# Patient Record
Sex: Female | Born: 1965
Health system: Southern US, Community
[De-identification: ages and names within clinical notes are randomized; demographics above are authoritative.]

## PROBLEM LIST (undated history)

## (undated) DIAGNOSIS — E119 Type 2 diabetes mellitus without complications: Secondary | ICD-10-CM

## (undated) DIAGNOSIS — F17200 Nicotine dependence, unspecified, uncomplicated: Secondary | ICD-10-CM

## (undated) HISTORY — PX: TUBAL LIGATION: SHX77

## (undated) HISTORY — DX: Nicotine dependence, unspecified, uncomplicated: F17.200

---

## 2003-12-12 ENCOUNTER — Emergency Department (HOSPITAL_COMMUNITY): Admission: EM | Admit: 2003-12-12 | Discharge: 2003-12-12 | Payer: Self-pay | Admitting: Emergency Medicine

## 2010-04-27 ENCOUNTER — Emergency Department (HOSPITAL_COMMUNITY): Admission: EM | Admit: 2010-04-27 | Discharge: 2010-04-27 | Payer: Self-pay | Admitting: Emergency Medicine

## 2012-07-13 ENCOUNTER — Emergency Department (HOSPITAL_COMMUNITY)
Admission: EM | Admit: 2012-07-13 | Discharge: 2012-07-13 | Disposition: A | Discharge status: Home / Self Care | Payer: Self-pay | Attending: Emergency Medicine | Admitting: Emergency Medicine

## 2012-07-13 ENCOUNTER — Encounter (HOSPITAL_COMMUNITY): Payer: Self-pay

## 2012-07-13 ENCOUNTER — Emergency Department (HOSPITAL_COMMUNITY): Payer: Self-pay

## 2012-07-13 DIAGNOSIS — S5290XA Unspecified fracture of unspecified forearm, initial encounter for closed fracture: Secondary | ICD-10-CM | POA: Insufficient documentation

## 2012-07-13 DIAGNOSIS — F17200 Nicotine dependence, unspecified, uncomplicated: Secondary | ICD-10-CM | POA: Insufficient documentation

## 2012-07-13 DIAGNOSIS — Y9389 Activity, other specified: Secondary | ICD-10-CM | POA: Insufficient documentation

## 2012-07-13 DIAGNOSIS — W010XXA Fall on same level from slipping, tripping and stumbling without subsequent striking against object, initial encounter: Secondary | ICD-10-CM | POA: Insufficient documentation

## 2012-07-13 DIAGNOSIS — Y9289 Other specified places as the place of occurrence of the external cause: Secondary | ICD-10-CM | POA: Insufficient documentation

## 2012-07-13 MED ORDER — TRAMADOL HCL 50 MG PO TABS
50.0000 mg | ORAL_TABLET | Freq: Once | ORAL | Status: AC
  Administered 2012-07-13: 50 mg via ORAL
  Filled 2012-07-13: qty 1

## 2012-07-13 MED ORDER — TRAMADOL HCL 50 MG PO TABS: 50.0000 mg | ORAL_TABLET | Freq: Four times a day (QID) | ORAL | Status: DC | PRN

## 2012-07-13 NOTE — Progress Notes (Signed)
Orthopedic Tech Progress Note Patient Details:  Linda Poole 12-11-1965 [id number] Applied volar splint to rt. forearm. Ortho Devices Type of Ortho Device: Arm sling;Short arm splint Ortho Device/Splint Interventions: Application   Lesle Chris 07/13/2012, 3:34 PM

## 2012-07-13 NOTE — ED Provider Notes (Signed)
History     CSN: [id number]  Arrival date & time 07/13/12  1313   First MD Initiated Contact with Patient 07/13/12 1419      Chief Complaint  Patient presents with  . Wrist Pain  . Fall    (Consider location/radiation/quality/duration/timing/severity/associated sxs/prior treatment) HPI Comments: 47 year old female presents emergency department complaining of right wrist and forearm pain after sustaining a fall about 4 a half hours prior to arrival. Patient states she was walking outside to go to work when she slipped and fell the ice and landed on her right hand. She then went on to go to work at [employer] and was unable to use her hand so her boss told her to go get it checked out. She tried taking Motrin with no relief. Describes pain as sharp and throbbing, rated 9/10. Moving her wrist makes the pain worse. Denies numbness or tingling. Denies hitting her head or loss of consciousness. Patient drove herself here today.  Patient is a 47 y.o. female presenting with wrist pain and fall. The history is provided by the patient.  Wrist Pain Pertinent negatives include no joint swelling or numbness.  Fall Pertinent negatives include no numbness.    History reviewed. No pertinent past medical history.  History reviewed. No pertinent past surgical history.  History reviewed. No pertinent family history.  History  Substance Use Topics  . Smoking status: Current Every Day Smoker -- 1.00 packs/day  . Smokeless tobacco: Not on file  . Alcohol Use: No    OB History   Grav Para Term Preterm Abortions TAB SAB Ect Mult Living                  Review of Systems  Musculoskeletal: Negative for joint swelling.       Positive for right wrist and forearm pain.  Skin: Negative for color change and wound.  Neurological: Negative for numbness.  All other systems reviewed and are negative.    Allergies  Review of patient's allergies indicates no known allergies.  Home Medications    Current Outpatient Rx  Name  Route  Sig  Dispense  Refill  . methadone (DOLOPHINE) 10 MG tablet   Oral   Take 10 mg by mouth every 8 (eight) hours.           BP 164/113  Pulse 112  Temp(Src) 98.1 F (36.7 C)  Resp 18  SpO2 94%  Physical Exam  Nursing note and vitals reviewed. Constitutional: She is oriented to person, place, and time. She appears well-developed and well-nourished. No distress.  HENT:  Head: Normocephalic and atraumatic.  Eyes: Conjunctivae and EOM are normal.  Neck: Normal range of motion. Neck supple.  Cardiovascular: Regular rhythm and intact distal pulses.  Tachycardia present.   Pulses:      Radial pulses are 2+ on the right side.  Good capillary refill.  Pulmonary/Chest: Effort normal and breath sounds normal.  Musculoskeletal:  Tenderness to palpation of right distal forearm and carpal bones. Positive snuffbox tenderness. Decreased range of motion with wrist flexion due to pain. Normal hand and elbow range of motion. No deformity. No edema, bruising or ecchymosis.  Neurological: She is alert and oriented to person, place, and time. No sensory deficit.  Skin: Skin is warm, dry and intact. No bruising and no ecchymosis noted.  Psychiatric: She has a normal mood and affect. Her behavior is normal.    ED Course  Procedures (including critical care time)  Labs Reviewed - No  data to display Dg Forearm Right  07/13/2012  *RADIOLOGY REPORT*  Clinical Data: Larey Seat today with hand and wrist pain  RIGHT FOREARM - 2 VIEW  Comparison: None.  Findings: There is a vague linear lucency across the distal right radius consistent with an impacted nondisplaced fracture.  An old well-corticated fracture fragment of the radial head is noted, but no joint effusion is seen.  Alignment is normal.  IMPRESSION: Subtle impaction fracture of the distal right radius.   Original Report Authenticated By: Dwyane Dee, M.D.    Dg Wrist Complete Right  07/13/2012  *RADIOLOGY REPORT*   Clinical Data: Larey Seat today with wrist and hand pain  RIGHT WRIST - COMPLETE 3+ VIEW  Comparison: None.  Findings: There is a subtle linear lucency through the distal right radius consistent with a minimally impacted fracture.  No extension into the radiocarpal joint space is seen.  The carpal bones are in normal position with normal intercarpal joint spaces.  IMPRESSION: Suspect subtle nondisplaced fracture of the distal right radius.   Original Report Authenticated By: Dwyane Dee, M.D.    Dg Hand Complete Right  07/13/2012  *RADIOLOGY REPORT*  Clinical Data: Slipped and fell today with hand and wrist pain  RIGHT HAND - COMPLETE 3+ VIEW  Comparison: None.  Findings: The carpal bones appear to be in normal position.  MCP, PIP, and DIP joints are unremarkable.  No acute fracture is seen.  IMPRESSION: No fracture.   Original Report Authenticated By: Dwyane Dee, M.D.      1. Radius fracture       MDM  47 year old female with subtle impaction fracture of distal right radius nondisplaced. She's able to find a ride home. She cannot have narcotic medications due to the fact that she is on methadone. I will give her tramadol. She will followup with orthopedics on Monday. Splint applied. Return precautions discussed. Patient states understanding of plan and is agreeable        Trevor Mace, PA-C 07/13/12 1506

## 2012-07-13 NOTE — ED Notes (Signed)
Pt presents with R hand, wrist and forearm pain after slipping on ice this morning.

## 2012-07-16 NOTE — ED Provider Notes (Signed)
Medical screening examination/treatment/procedure(s) were performed by non-physician practitioner and as supervising physician I was immediately available for consultation/collaboration.  Raeford Razor, MD 07/16/12 [phone]

## 2012-10-23 ENCOUNTER — Emergency Department (HOSPITAL_COMMUNITY)
Admission: EM | Admit: 2012-10-23 | Discharge: 2012-10-23 | Disposition: A | Discharge status: Home / Self Care | Payer: Self-pay | Attending: Emergency Medicine | Admitting: Emergency Medicine

## 2012-10-23 ENCOUNTER — Encounter (HOSPITAL_COMMUNITY): Payer: Self-pay | Admitting: Emergency Medicine

## 2012-10-23 DIAGNOSIS — E119 Type 2 diabetes mellitus without complications: Secondary | ICD-10-CM | POA: Insufficient documentation

## 2012-10-23 DIAGNOSIS — Z79899 Other long term (current) drug therapy: Secondary | ICD-10-CM | POA: Insufficient documentation

## 2012-10-23 DIAGNOSIS — F17200 Nicotine dependence, unspecified, uncomplicated: Secondary | ICD-10-CM | POA: Insufficient documentation

## 2012-10-23 DIAGNOSIS — Y939 Activity, unspecified: Secondary | ICD-10-CM | POA: Insufficient documentation

## 2012-10-23 DIAGNOSIS — W19XXXA Unspecified fall, initial encounter: Secondary | ICD-10-CM | POA: Insufficient documentation

## 2012-10-23 DIAGNOSIS — Y929 Unspecified place or not applicable: Secondary | ICD-10-CM | POA: Insufficient documentation

## 2012-10-23 LAB — CBC WITH DIFFERENTIAL/PLATELET
MCHC: 31.4 g/dL (ref 30.0–36.0)
MCV: 87.8 fL (ref 78.0–100.0)
Monocytes Relative: 6 % (ref 3–12)
Platelets: 280 10*3/uL (ref 150–400)
RDW: 14.8 % (ref 11.5–15.5)
WBC: 14.3 10*3/uL — ABNORMAL HIGH (ref 4.0–10.5)

## 2012-10-23 LAB — URINALYSIS, ROUTINE W REFLEX MICROSCOPIC
Bilirubin Urine: NEGATIVE
Glucose, UA: >1000 mg/dL — AB
Hgb urine dipstick: NEGATIVE
Leukocytes, UA: NEGATIVE
Nitrite: NEGATIVE
Protein, ur: NEGATIVE mg/dL

## 2012-10-23 LAB — BASIC METABOLIC PANEL
BUN: 16 mg/dL (ref 6–23)
CO2: 27 mEq/L (ref 19–32)
Calcium: 9.2 mg/dL (ref 8.4–10.5)
Creatinine, Ser: 0.35 mg/dL — ABNORMAL LOW (ref 0.50–1.10)
GFR calc non Af Amer: >90 mL/min (ref >90)
Glucose, Bld: 248 mg/dL — ABNORMAL HIGH (ref 70–99)

## 2012-10-23 LAB — GLUCOSE, CAPILLARY: Glucose-Capillary: 255 mg/dL — ABNORMAL HIGH (ref 70–99)

## 2012-10-23 LAB — URINE MICROSCOPIC-ADD ON

## 2012-10-23 MED ORDER — METFORMIN HCL 500 MG PO TABS: 500.0000 mg | ORAL_TABLET | Freq: Two times a day (BID) | ORAL | Status: DC

## 2012-10-23 NOTE — ED Provider Notes (Signed)
History     CSN: [id number]  Arrival date & time 10/23/12  1725   First MD Initiated Contact with Patient 10/23/12 2028      Chief Complaint  Patient presents with  . Fall    (Consider location/radiation/quality/duration/timing/severity/associated sxs/prior treatment) Patient is a 47 y.o. female presenting with fall. The history is provided by the patient. No language interpreter was used.  Fall This is a recurrent problem. The current episode started more than 1 month ago. The problem occurs intermittently. The problem has been unchanged. Associated symptoms include fatigue and headaches. Pertinent negatives include no chest pain, nausea, numbness or vomiting. She has tried nothing for the symptoms.    History reviewed. No pertinent past medical history.  No past surgical history on file.  No family history on file.  History  Substance Use Topics  . Smoking status: Current Every Day Smoker -- 1.00 packs/day  . Smokeless tobacco: Not on file  . Alcohol Use: No    OB History   Grav Para Term Preterm Abortions TAB SAB Ect Mult Living                  Review of Systems  Constitutional: Positive for fatigue.  Cardiovascular: Negative for chest pain.  Gastrointestinal: Negative for nausea and vomiting.  Endocrine: Positive for polydipsia and polyuria.  Genitourinary: Positive for frequency.  Neurological: Positive for headaches. Negative for numbness.  All other systems reviewed and are negative.    Allergies  Review of patient's allergies indicates no known allergies.  Home Medications   Current Outpatient Rx  Name  Route  Sig  Dispense  Refill  . ibuprofen (ADVIL,MOTRIN) 200 MG tablet   Oral   Take 200 mg by mouth every 6 (six) hours as needed for pain. For pain         . methadone (DOLOPHINE) 10 MG tablet   Oral   Take 10 mg by mouth every 8 (eight) hours.           BP 164/92  Pulse 90  Temp(Src) 98.6 F (37 C) (Oral)  Resp 18  SpO2  100%  Physical Exam  Vitals reviewed. Constitutional: She is oriented to person, place, and time. She appears well-developed and well-nourished.  HENT:  Head: Normocephalic and atraumatic.  Eyes: Pupils are equal, round, and reactive to light.  Neck: Normal range of motion. Neck supple.  Cardiovascular: Normal rate and regular rhythm.   Pulmonary/Chest: Effort normal and breath sounds normal.  Abdominal: Soft. Bowel sounds are normal. She exhibits no distension. There is no tenderness.  Musculoskeletal: Normal range of motion. She exhibits tenderness.       Arms: Lymphadenopathy:    She has no cervical adenopathy.  Neurological: She is alert and oriented to person, place, and time.  Skin: Skin is warm and dry.  Psychiatric: She has a normal mood and affect. Her behavior is normal. Judgment and thought content normal.    ED Course  Procedures (including critical care time)  Labs Reviewed  GLUCOSE, CAPILLARY - Abnormal; Notable for the following:    Glucose-Capillary 255 (*)    All other components within normal limits  GLUCOSE, CAPILLARY - Abnormal; Notable for the following:    Glucose-Capillary 232 (*)    All other components within normal limits   No results found.   No diagnosis found.  Labs consistent with diabetes (type 2).  Will start on metformin.  States she has an appointment with a PCP June 11.  Referral  provided to [facility].    MDM          Jimmye Norman, NP 10/23/12 2350

## 2012-10-23 NOTE — ED Notes (Signed)
Pt states that she has fallen three times in the last month.  States that she does not know why she falls.  Unsure if she is losing consciousness or if she is just feeling clumsy.  States that she went to the clinic and tested positive for alcohol and they told her that it could be because she is diabetic.  Pt states that she wants to be checked for "the sugar diabetes".

## 2012-10-24 NOTE — ED Provider Notes (Signed)
Medical screening examination/treatment/procedure(s) were performed by non-physician practitioner and as supervising physician I was immediately available for consultation/collaboration.   Gwyneth Sprout, MD 10/24/12 [phone]

## 2013-01-10 ENCOUNTER — Encounter (HOSPITAL_COMMUNITY): Payer: Self-pay | Admitting: Emergency Medicine

## 2013-01-10 ENCOUNTER — Emergency Department (HOSPITAL_COMMUNITY)
Admission: EM | Admit: 2013-01-10 | Discharge: 2013-01-11 | Disposition: A | Discharge status: Home / Self Care | Payer: Self-pay | Attending: Emergency Medicine | Admitting: Emergency Medicine

## 2013-01-10 DIAGNOSIS — K029 Dental caries, unspecified: Secondary | ICD-10-CM | POA: Insufficient documentation

## 2013-01-10 DIAGNOSIS — F17200 Nicotine dependence, unspecified, uncomplicated: Secondary | ICD-10-CM | POA: Insufficient documentation

## 2013-01-10 DIAGNOSIS — K047 Periapical abscess without sinus: Secondary | ICD-10-CM | POA: Insufficient documentation

## 2013-01-10 DIAGNOSIS — E119 Type 2 diabetes mellitus without complications: Secondary | ICD-10-CM | POA: Insufficient documentation

## 2013-01-10 DIAGNOSIS — Z79899 Other long term (current) drug therapy: Secondary | ICD-10-CM | POA: Insufficient documentation

## 2013-01-10 DIAGNOSIS — R599 Enlarged lymph nodes, unspecified: Secondary | ICD-10-CM | POA: Insufficient documentation

## 2013-01-10 DIAGNOSIS — Z792 Long term (current) use of antibiotics: Secondary | ICD-10-CM | POA: Insufficient documentation

## 2013-01-10 HISTORY — DX: Type 2 diabetes mellitus without complications: E11.9
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
## 2013-01-10 NOTE — ED Notes (Signed)
Pt c/o r side dental abscess x2 days. Swelling noted on r side of face radiating down to pts neck.  Pt NAD at this time.

## 2013-01-11 ENCOUNTER — Emergency Department (HOSPITAL_COMMUNITY): Payer: Self-pay

## 2013-01-11 ENCOUNTER — Encounter (HOSPITAL_COMMUNITY): Payer: Self-pay

## 2013-01-11 MED ORDER — CLINDAMYCIN HCL 300 MG PO CAPS
450.0000 mg | ORAL_CAPSULE | Freq: Once | ORAL | Status: DC
  Filled 2013-01-11: qty 1

## 2013-01-11 MED ORDER — SODIUM CHLORIDE 0.9 % IV BOLUS (SEPSIS)
1000.0000 mL | Freq: Once | INTRAVENOUS | Status: AC
  Administered 2013-01-11: 1000 mL via INTRAVENOUS

## 2013-01-11 MED ORDER — FENTANYL CITRATE 0.05 MG/ML IJ SOLN
50.0000 ug | Freq: Once | INTRAMUSCULAR | Status: AC
  Administered 2013-01-11: 50 ug via INTRAVENOUS
  Filled 2013-01-11: qty 2

## 2013-01-11 MED ORDER — CLINDAMYCIN PHOSPHATE 900 MG/50ML IV SOLN
900.0000 mg | Freq: Once | INTRAVENOUS | Status: AC
  Administered 2013-01-11: 900 mg via INTRAVENOUS
  Filled 2013-01-11: qty 50

## 2013-01-11 MED ORDER — IOHEXOL 300 MG/ML  SOLN
100.0000 mL | Freq: Once | INTRAMUSCULAR | Status: AC | PRN
  Administered 2013-01-11: 100 mL via INTRAVENOUS

## 2013-01-11 MED ORDER — TRAMADOL HCL 50 MG PO TABS: 50.0000 mg | ORAL_TABLET | Freq: Four times a day (QID) | ORAL | Status: DC | PRN

## 2013-01-11 MED ORDER — PENICILLIN V POTASSIUM 500 MG PO TABS: 500.0000 mg | ORAL_TABLET | Freq: Four times a day (QID) | ORAL | Status: AC

## 2013-01-11 NOTE — ED Provider Notes (Signed)
Patient received 900 mg clindamycin, IV.  She's been able to drink, and tolerate fluids.  She will followup with the dentist in the morning.  She has been given a prescription and pain medication prescribed by her previous mid-level.  Patient agrees with discharge, and followup plan  Arman Filter, NP 01/11/13 1610

## 2013-01-11 NOTE — ED Notes (Signed)
Patient transported to CT 

## 2013-01-11 NOTE — ED Provider Notes (Signed)
Medical screening examination/treatment/procedure(s) were performed by non-physician practitioner and as supervising physician I was immediately available for consultation/collaboration.   Odin Mariani L Graden Hoshino, MD 01/11/13 0754 

## 2015-05-25 ENCOUNTER — Emergency Department (HOSPITAL_COMMUNITY)
Admission: EM | Admit: 2015-05-25 | Discharge: 2015-05-25 | Disposition: A | Discharge status: Home / Self Care | Payer: Self-pay | Attending: Emergency Medicine | Admitting: Emergency Medicine

## 2015-05-25 ENCOUNTER — Encounter (HOSPITAL_COMMUNITY): Payer: Self-pay | Admitting: Emergency Medicine

## 2015-05-25 DIAGNOSIS — F17200 Nicotine dependence, unspecified, uncomplicated: Secondary | ICD-10-CM | POA: Insufficient documentation

## 2015-05-25 DIAGNOSIS — L03012 Cellulitis of left finger: Secondary | ICD-10-CM | POA: Insufficient documentation

## 2015-05-25 LAB — I-STAT VENOUS BLOOD GAS, ED
Acid-base deficit: 2 mmol/L (ref 0.0–2.0)
BICARBONATE: 23.3 meq/L (ref 20.0–24.0)
O2 Saturation: 99 %
PH VEN: 7.357 — AB (ref 7.250–7.300)
PO2 VEN: 143 mmHg — AB (ref 30.0–45.0)
TCO2: 25 mmol/L (ref 0–100)
pCO2, Ven: 41.5 mmHg — ABNORMAL LOW (ref 45.0–50.0)

## 2015-05-25 LAB — BASIC METABOLIC PANEL
Anion gap: 13 (ref 5–15)
CHLORIDE: 97 mmol/L — AB (ref 101–111)
CO2: 19 mmol/L — AB (ref 22–32)
CREATININE: 0.61 mg/dL (ref 0.44–1.00)
Calcium: 9.1 mg/dL (ref 8.9–10.3)
GFR calc Af Amer: >60 mL/min (ref >60)
GFR calc non Af Amer: >60 mL/min (ref >60)
Glucose, Bld: 614 mg/dL (ref 65–99)
Potassium: 3.8 mmol/L (ref 3.5–5.1)
Sodium: 129 mmol/L — ABNORMAL LOW (ref 135–145)

## 2015-05-25 LAB — CBG MONITORING, ED
GLUCOSE-CAPILLARY: 286 mg/dL — AB (ref 65–99)
Glucose-Capillary: >600 mg/dL (ref 65–99)
Glucose-Capillary: 312 mg/dL — ABNORMAL HIGH (ref 65–99)
Glucose-Capillary: 330 mg/dL — ABNORMAL HIGH (ref 65–99)

## 2015-05-25 MED ORDER — SODIUM CHLORIDE 0.9 % IV SOLN
INTRAVENOUS | Status: DC
  Filled 2015-05-25: qty 2.5

## 2015-05-25 MED ORDER — CEPHALEXIN 250 MG PO CAPS
500.0000 mg | ORAL_CAPSULE | Freq: Once | ORAL | Status: AC
  Administered 2015-05-25: 500 mg via ORAL
  Filled 2015-05-25: qty 2

## 2015-05-25 MED ORDER — DEXTROSE-NACL 5-0.45 % IV SOLN: INTRAVENOUS | Status: DC

## 2015-05-25 MED ORDER — CEPHALEXIN 500 MG PO CAPS: 500.0000 mg | ORAL_CAPSULE | Freq: Three times a day (TID) | ORAL | Status: DC

## 2015-05-25 MED ORDER — METFORMIN HCL 500 MG PO TABS: 500.0000 mg | ORAL_TABLET | Freq: Two times a day (BID) | ORAL | Status: DC

## 2015-05-25 MED ORDER — SODIUM CHLORIDE 0.9 % IV BOLUS (SEPSIS)
2000.0000 mL | Freq: Once | INTRAVENOUS | Status: AC
  Administered 2015-05-25: 2000 mL via INTRAVENOUS

## 2015-05-25 MED ORDER — LIDOCAINE HCL 2 % IJ SOLN
5.0000 mL | Freq: Once | INTRAMUSCULAR | Status: AC
  Administered 2015-05-25: 100 mg
  Filled 2015-05-25: qty 20

## 2015-05-25 MED ORDER — INSULIN ASPART 100 UNIT/ML ~~LOC~~ SOLN
5.0000 [IU] | Freq: Once | SUBCUTANEOUS | Status: AC
  Administered 2015-05-25: 5 [IU] via SUBCUTANEOUS
  Filled 2015-05-25: qty 1

## 2015-05-25 NOTE — Progress Notes (Signed)
[facility] ED CM consulted by [facility], [facility] for consult [facility] for [facility] and pcp assistance CM transferred to pt room phone CM spoke with pt who [place].  CM discussed and provided written information for uninsured accepting pcps, discussed the importance of pcp vs EDP services for f/u care, www.needymeds.org, www.goodrx.com, discounted pharmacies and other [place] resources such as [facility] , [facility], affordable care act, financial assistance, uninsured dental services, [place] med assist, DSS and  health department  Reviewed resources for [place] uninsured accepting pcps like [facility], [facility] medicine [facility] [address], [facility], [facility], local urgent care centers, [facility], [facility] family practice, general medical clinics, family services of the [place], [facility] urgent care plus others, medication resources, [facility] out patient pharmacies and housing Pt voiced understanding and appreciation of resources provided   Provided [facility] contact information Pt agreed to a referral Cm completed referral Pt to be contact by [facility] clinical liason  CM reviewed goodrx r/t metformin, methadone and ultram. Provided copies of goodrx cost list for each and discount card for [facility]adone for [facility] Discussed metformin and ultram on [facility] $4 list  Cm received fax confirmation at 05/25/15 1126 for 13 pages of uninsured resources including goodrx sheets to [phone]   Cm informed tracy to have clark Rn given resources to pt prior to leaving w/c ED  Pt denied further questions from her nor her sister at bedside

## 2015-05-25 NOTE — ED Provider Notes (Signed)
CSN: [id number]     Arrival date &[id number] time 05/25/15  [id number] History   First MD Initiated Contact with Patient 05/25/15 [phone]     No chief complaint on file.    (Consider location/radiation/quality/duration/timing/severity/associated sxs/prior Treatment) HPI Complains of painful left index finger at distal phalanx for the past 3 days. Patient also reports that her blood sugar has been running high. She last checked her blood sugar 3 or 4 days ago. Other associated symptoms include generalized malaise. She denies fever denies cough no nausea or vomiting no treatment prior to coming here. She's been somewhat noncompliant with her medications,, she's had trouble affording them Past Medical History  Diagnosis Date  . Diabetes mellitus without complication    No past surgical history on file. No family history on file. Social History  Substance Use Topics  . Smoking status: Current Every Day Smoker -- 1.00 packs/day  . Smokeless tobacco: Not on file  . Alcohol Use: No   no illicit drug use OB History    No data available     Review of Systems  Constitutional:       Generalized malaise  Musculoskeletal: Positive for arthralgias.       Left index finger pain  Allergic/Immunologic: Positive for immunocompromised state.      Allergies  Review of patient's allergies indicates no known allergies.  Home Medications   Prior to Admission medications   Medication Sig Start Date End Date Taking? Authorizing Provider  ibuprofen (ADVIL,MOTRIN) 200 MG tablet Take 800 mg by mouth every 6 (six) hours as needed for pain. For pain    Historical Provider, MD  metFORMIN (GLUCOPHAGE) 500 MG tablet Take 1 tablet (500 mg total) by mouth 2 (two) times daily with a meal. 10/23/12   Felicie Mornavid Smith, NP  methadone (DOLOPHINE) 10 MG tablet Take 10 mg by mouth every 8 (eight) hours.    Historical Provider, MD  traMADol (ULTRAM) 50 MG tablet Take 1 tablet (50 mg total) by mouth every 6 (six) hours as needed for  pain. 01/11/13   Hannah Muthersbaugh, PA-C   There were no vitals taken for this visit. Physical Exam  Constitutional: She appears well-developed and well-nourished.  HENT:  Head: Normocephalic and atraumatic.  U Chris membranes dry, poor dentition  Eyes: Conjunctivae are normal. Pupils are equal, round, and reactive to light.  Neck: Neck supple. No tracheal deviation present. No thyromegaly present.  Cardiovascular: Normal rate and regular rhythm.   No murmur heard. Pulmonary/Chest: Effort normal and breath sounds normal.  Abdominal: Soft. Bowel sounds are normal. She exhibits no distension. There is no tenderness.  Musculoskeletal: Normal range of motion. She exhibits no edema or tenderness.  Left upper extremity Index finger with paronychia. Otherwise atraumatic. No redness or swelling tenderness proximal to the cuticle. All other extremities without redness swelling or tenderness neurovascular intact  Neurological: She is alert. No cranial nerve deficit. Coordination normal.  Skin: Skin is warm and dry. No rash noted.  Psychiatric: She has a normal mood and affect.  Nursing note and vitals reviewed.   ED Course  Procedures (including critical care time) Labs Review Labs Reviewed - No data to display  Imaging Review No results found. I have personally reviewed and evaluated these images and lab results as part of my medical decision-making.   EKG Interpretation None     Paronychia at left index finger was incised and drained by me. Procedure Timeout performed. Digital block with 2% Xylocaine performed. A #11 blade was used  to make an incision at the cuticle with copious drainage of frank pus. Finger was then irrigated with tap water. Place a sterile bandage with anabolic ointment  1 PM patient feels much improved after treatment with intravenous fluids and subcutaneous insulin. Results for orders placed or performed during the hospital encounter of 05/25/15  Basic metabolic  panel  Result Value Ref Range   Sodium 129 (L) 135 - 145 mmol/L   Potassium 3.8 3.5 - 5.1 mmol/L   Chloride 97 (L) 101 - 111 mmol/L   CO2 19 (L) 22 - 32 mmol/L   Glucose, Bld 614 (HH) 65 - 99 mg/dL   BUN <5 (L) 6 - 20 mg/dL   Creatinine, Ser 4.09 0.44 - 1.00 mg/dL   Calcium 9.1 8.9 - 81.1 mg/dL   GFR calc non Af Amer >60 >60 mL/min   GFR calc Af Amer >60 >60 mL/min   Anion gap 13 5 - 15  CBG monitoring, ED  Result Value Ref Range   Glucose-Capillary >600 (HH) 65 - 99 mg/dL  I-Stat venous blood gas, ED  Result Value Ref Range   pH, Ven 7.357 (H) 7.250 - 7.300   pCO2, Ven 41.5 (L) 45.0 - 50.0 mmHg   pO2, Ven 143.0 (H) 30.0 - 45.0 mmHg   Bicarbonate 23.3 20.0 - 24.0 mEq/L   TCO2 25 0 - 100 mmol/L   O2 Saturation 99.0 %   Acid-base deficit 2.0 0.0 - 2.0 mmol/L   Patient temperature HIDE    Sample type VENOUS   CBG monitoring, ED  Result Value Ref Range   Glucose-Capillary 312 (H) 65 - 99 mg/dL  CBG monitoring, ED  Result Value Ref Range   Glucose-Capillary 330 (H) 65 - 99 mg/dL  CBG monitoring, ED  Result Value Ref Range   Glucose-Capillary 286 (H) 65 - 99 mg/dL   No results found.  MDM   is followed at methadone clinic. She needs primary care physician. Patient spoke with case manager who will arrange for orange card. She'll be referred to [facility] community wellness [facility]. Tylenol for pain. Prescription Keflex & . Metformin Wound recheck 2 or 3 days Final diagnoses:  None   diagnosis #1 paronychia left index finger #2 hyperglycemia #3 medication noncompliance      Doug Sou, MD 05/25/15 1308

## 2015-05-25 NOTE — ED Notes (Signed)
Patient complains of pain on the left index finger and states that her home blood glucose monitor has been reading "high" for the last two months.  CBG checked during triage, ED monitor also read "high".  Patient in no apparent distress at this time.

## 2015-05-25 NOTE — Discharge Instructions (Signed)
Paronychia  Take your metformin and the antibiotic as prescribed. Take Tylenol as directed for pain. Your finger should be rechecked in 3 days. Go to an urgent care center or the [facility]. [facility] today to get a primary care physician.. Return if concerned for any reason Paronychia is an infection of the skin. It happens near a fingernail or toenail. It may cause pain and swelling around the nail. Usually, it is not serious and it clears up with treatment. HOME CARE  Soak the fingers or toes in warm water as told by your doctor. You may be told to do this for 20 minutes, 2-3 times a day.  Keep the area dry when you are not soaking it.  Take medicines only as told by your doctor.  If you were given an antibiotic medicine, finish all of it even if you start to feel better.  Keep the affected area clean.  Do not try to drain a fluid-filled bump yourself.  Wear rubber gloves when putting your hands in water.  Wear gloves if your hands might touch cleaners or chemicals.  Follow your doctor's instructions about:  Wound care.  Bandage (dressing) changes and removal. GET HELP IF:  Your symptoms get worse or do not improve.  You have a fever or chills.  You have redness spreading from the affected area.  You have more fluid, blood, or pus coming from the affected area.  Your finger or knuckle is swollen or is hard to move.   This information is not intended to replace advice given to you by your health care provider. Make sure you discuss any questions you have with your health care provider.   Document Released: 05/04/2009 Document Revised: 09/30/2014 Document Reviewed: 04/23/2014 Elsevier Interactive Patient Education [employer]2016 Elsevier Inc.

## 2015-05-25 NOTE — Progress Notes (Signed)
Entered in d/c instructions goodrx.com Use to assist with discount costs at pharmacies Go to www.goodrx.com Please use the resources provided to you in emergency room by case manager to assist with doctor for follow up On 05/28/2015 A referral for you has been sent to [facility] if you have not received a call in 3 days you may contact them Call Scherry RanKaren Andrianos at [phone] Tuesday-Friday [email or web address] These [place] uninsured resources provide possible primary care providers, resources for discounted medications, housing, dental resources, affordable care act information, plus other resources for [facility] [place] [place]

## 2015-06-09 ENCOUNTER — Emergency Department (HOSPITAL_COMMUNITY): Payer: Self-pay

## 2015-06-09 ENCOUNTER — Inpatient Hospital Stay (HOSPITAL_COMMUNITY)
Admission: EM | Admit: 2015-06-09 | Discharge: 2015-06-15 | DRG: 854 | Disposition: A | Discharge status: Home / Self Care | Payer: Self-pay | Attending: Internal Medicine | Admitting: Internal Medicine

## 2015-06-09 ENCOUNTER — Encounter (HOSPITAL_COMMUNITY): Payer: Self-pay | Admitting: Emergency Medicine

## 2015-06-09 DIAGNOSIS — B951 Streptococcus, group B, as the cause of diseases classified elsewhere: Secondary | ICD-10-CM | POA: Diagnosis present

## 2015-06-09 DIAGNOSIS — R739 Hyperglycemia, unspecified: Secondary | ICD-10-CM

## 2015-06-09 DIAGNOSIS — E1100 Type 2 diabetes mellitus with hyperosmolarity without nonketotic hyperglycemic-hyperosmolar coma (NKHHC): Secondary | ICD-10-CM

## 2015-06-09 DIAGNOSIS — E870 Hyperosmolality and hypernatremia: Secondary | ICD-10-CM | POA: Diagnosis not present

## 2015-06-09 DIAGNOSIS — L03031 Cellulitis of right toe: Secondary | ICD-10-CM | POA: Diagnosis present

## 2015-06-09 DIAGNOSIS — R52 Pain, unspecified: Secondary | ICD-10-CM

## 2015-06-09 DIAGNOSIS — F17200 Nicotine dependence, unspecified, uncomplicated: Secondary | ICD-10-CM | POA: Diagnosis present

## 2015-06-09 DIAGNOSIS — E871 Hypo-osmolality and hyponatremia: Secondary | ICD-10-CM | POA: Diagnosis present

## 2015-06-09 DIAGNOSIS — E11621 Type 2 diabetes mellitus with foot ulcer: Secondary | ICD-10-CM | POA: Diagnosis present

## 2015-06-09 DIAGNOSIS — Z91128 Patient's intentional underdosing of medication regimen for other reason: Secondary | ICD-10-CM

## 2015-06-09 DIAGNOSIS — L97519 Non-pressure chronic ulcer of other part of right foot with unspecified severity: Secondary | ICD-10-CM | POA: Diagnosis present

## 2015-06-09 DIAGNOSIS — A419 Sepsis, unspecified organism: Principal | ICD-10-CM | POA: Diagnosis present

## 2015-06-09 DIAGNOSIS — E860 Dehydration: Secondary | ICD-10-CM | POA: Diagnosis present

## 2015-06-09 DIAGNOSIS — Z7984 Long term (current) use of oral hypoglycemic drugs: Secondary | ICD-10-CM

## 2015-06-09 DIAGNOSIS — D649 Anemia, unspecified: Secondary | ICD-10-CM | POA: Diagnosis present

## 2015-06-09 DIAGNOSIS — E11628 Type 2 diabetes mellitus with other skin complications: Secondary | ICD-10-CM | POA: Diagnosis present

## 2015-06-09 DIAGNOSIS — L02611 Cutaneous abscess of right foot: Secondary | ICD-10-CM | POA: Diagnosis present

## 2015-06-09 DIAGNOSIS — L089 Local infection of the skin and subcutaneous tissue, unspecified: Secondary | ICD-10-CM

## 2015-06-09 DIAGNOSIS — E876 Hypokalemia: Secondary | ICD-10-CM | POA: Diagnosis not present

## 2015-06-09 DIAGNOSIS — L03818 Cellulitis of other sites: Secondary | ICD-10-CM

## 2015-06-09 DIAGNOSIS — E1165 Type 2 diabetes mellitus with hyperglycemia: Secondary | ICD-10-CM | POA: Diagnosis present

## 2015-06-09 DIAGNOSIS — Z833 Family history of diabetes mellitus: Secondary | ICD-10-CM

## 2015-06-09 DIAGNOSIS — M869 Osteomyelitis, unspecified: Secondary | ICD-10-CM | POA: Diagnosis present

## 2015-06-09 DIAGNOSIS — E1169 Type 2 diabetes mellitus with other specified complication: Secondary | ICD-10-CM | POA: Diagnosis present

## 2015-06-09 DIAGNOSIS — T383X6A Underdosing of insulin and oral hypoglycemic [antidiabetic] drugs, initial encounter: Secondary | ICD-10-CM | POA: Diagnosis present

## 2015-06-09 DIAGNOSIS — L0390 Cellulitis, unspecified: Secondary | ICD-10-CM

## 2015-06-09 DIAGNOSIS — F1190 Opioid use, unspecified, uncomplicated: Secondary | ICD-10-CM | POA: Diagnosis present

## 2015-06-09 LAB — COMPREHENSIVE METABOLIC PANEL
ALT: 15 U/L (ref 14–54)
AST: 15 U/L (ref 15–41)
Albumin: 3.6 g/dL (ref 3.5–5.0)
Alkaline Phosphatase: 167 U/L — ABNORMAL HIGH (ref 38–126)
Anion gap: 16 — ABNORMAL HIGH (ref 5–15)
BILIRUBIN TOTAL: 0.5 mg/dL (ref 0.3–1.2)
CHLORIDE: 84 mmol/L — AB (ref 101–111)
CO2: 27 mmol/L (ref 22–32)
CREATININE: 0.72 mg/dL (ref 0.44–1.00)
Calcium: 10.1 mg/dL (ref 8.9–10.3)
GFR calc Af Amer: >60 mL/min (ref >60)
Glucose, Bld: 662 mg/dL (ref 65–99)
Potassium: 3.5 mmol/L (ref 3.5–5.1)
Sodium: 127 mmol/L — ABNORMAL LOW (ref 135–145)
TOTAL PROTEIN: 8.2 g/dL — AB (ref 6.5–8.1)

## 2015-06-09 LAB — CBC WITH DIFFERENTIAL/PLATELET
Basophils Absolute: 0 10*3/uL (ref 0.0–0.1)
Basophils Relative: 0 %
EOS PCT: 0 %
Eosinophils Absolute: 0 10*3/uL (ref 0.0–0.7)
HEMATOCRIT: 40.8 % (ref 36.0–46.0)
HEMOGLOBIN: 13.9 g/dL (ref 12.0–15.0)
Lymphocytes Relative: 10 %
Lymphs Abs: 2.8 10*3/uL (ref 0.7–4.0)
MCH: 30.5 pg (ref 26.0–34.0)
MCHC: 34.1 g/dL (ref 30.0–36.0)
MCV: 89.5 fL (ref 78.0–100.0)
MONOS PCT: 9 %
Monocytes Absolute: 2.5 10*3/uL — ABNORMAL HIGH (ref 0.1–1.0)
NEUTROS PCT: 81 %
Neutro Abs: 22.2 10*3/uL — ABNORMAL HIGH (ref 1.7–7.7)
Platelets: 339 10*3/uL (ref 150–400)
RBC: 4.56 MIL/uL (ref 3.87–5.11)
RDW: 13.4 % (ref 11.5–15.5)
WBC: 27.5 10*3/uL — AB (ref 4.0–10.5)

## 2015-06-09 LAB — URINALYSIS, ROUTINE W REFLEX MICROSCOPIC
Bilirubin Urine: NEGATIVE
Hgb urine dipstick: NEGATIVE
KETONES UR: NEGATIVE mg/dL
LEUKOCYTES UA: NEGATIVE
Nitrite: NEGATIVE
PH: 5.5 (ref 5.0–8.0)
Protein, ur: NEGATIVE mg/dL
Specific Gravity, Urine: 1.038 — ABNORMAL HIGH (ref 1.005–1.030)

## 2015-06-09 LAB — CBG MONITORING, ED
GLUCOSE-CAPILLARY: 200 mg/dL — AB (ref 65–99)
GLUCOSE-CAPILLARY: 302 mg/dL — AB (ref 65–99)
GLUCOSE-CAPILLARY: 572 mg/dL — AB (ref 65–99)
Glucose-Capillary: >600 mg/dL (ref 65–99)
Glucose-Capillary: 470 mg/dL — ABNORMAL HIGH (ref 65–99)

## 2015-06-09 LAB — URINE MICROSCOPIC-ADD ON

## 2015-06-09 LAB — PROCALCITONIN: PROCALCITONIN: 0.55 ng/mL

## 2015-06-09 LAB — URIC ACID: URIC ACID, SERUM: 2.7 mg/dL (ref 2.3–6.6)

## 2015-06-09 LAB — I-STAT CG4 LACTIC ACID, ED
LACTIC ACID, VENOUS: 3.83 mmol/L — AB (ref 0.5–2.0)
Lactic Acid, Venous: 3.36 mmol/L (ref 0.5–2.0)

## 2015-06-09 LAB — SEDIMENTATION RATE: SED RATE: 83 mm/h — AB (ref 0–22)

## 2015-06-09 MED ORDER — SODIUM CHLORIDE 0.9 % IV SOLN
INTRAVENOUS | Status: DC
  Administered 2015-06-09: 4.1 [IU]/h via INTRAVENOUS
  Filled 2015-06-09: qty 2.5

## 2015-06-09 MED ORDER — SODIUM CHLORIDE 0.9 % IV BOLUS (SEPSIS)
1000.0000 mL | Freq: Once | INTRAVENOUS | Status: AC
  Administered 2015-06-09: 1000 mL via INTRAVENOUS

## 2015-06-09 MED ORDER — VANCOMYCIN HCL IN DEXTROSE 750-5 MG/150ML-% IV SOLN
750.0000 mg | Freq: Two times a day (BID) | INTRAVENOUS | Status: DC
  Administered 2015-06-10 – 2015-06-15 (×11): 750 mg via INTRAVENOUS
  Filled 2015-06-09 (×13): qty 150

## 2015-06-09 MED ORDER — DEXTROSE 5 % IV SOLN
2.0000 g | INTRAVENOUS | Status: DC
  Administered 2015-06-09: 2 g via INTRAVENOUS
  Filled 2015-06-09: qty 2

## 2015-06-09 MED ORDER — VANCOMYCIN HCL 10 G IV SOLR
1250.0000 mg | Freq: Once | INTRAVENOUS | Status: AC
  Administered 2015-06-09: 1250 mg via INTRAVENOUS
  Filled 2015-06-09: qty 1250

## 2015-06-09 MED ORDER — METRONIDAZOLE IN NACL 5-0.79 MG/ML-% IV SOLN
500.0000 mg | Freq: Three times a day (TID) | INTRAVENOUS | Status: DC
  Administered 2015-06-09: 500 mg via INTRAVENOUS
  Filled 2015-06-09 (×3): qty 100

## 2015-06-09 MED ORDER — DEXTROSE-NACL 5-0.45 % IV SOLN: INTRAVENOUS | Status: DC

## 2015-06-09 MED ORDER — SODIUM CHLORIDE 0.9 % IV SOLN
1000.0000 mL | Freq: Once | INTRAVENOUS | Status: AC
  Administered 2015-06-09: 1000 mL via INTRAVENOUS

## 2015-06-09 MED ORDER — SODIUM CHLORIDE 0.9 % IV BOLUS (SEPSIS): 1000.0000 mL | Freq: Once | INTRAVENOUS | Status: DC

## 2015-06-09 MED ORDER — SODIUM CHLORIDE 0.9 % IV SOLN
1000.0000 mL | INTRAVENOUS | Status: DC
  Administered 2015-06-10: 1000 mL via INTRAVENOUS

## 2015-06-09 MED ORDER — SODIUM CHLORIDE 0.9 % IV BOLUS (SEPSIS)
2000.0000 mL | Freq: Once | INTRAVENOUS | Status: AC
  Administered 2015-06-09: 2000 mL via INTRAVENOUS

## 2015-06-09 MED ORDER — IBUPROFEN 800 MG PO TABS
800.0000 mg | ORAL_TABLET | Freq: Once | ORAL | Status: AC
  Administered 2015-06-09: 800 mg via ORAL
  Filled 2015-06-09: qty 1

## 2015-06-09 NOTE — H&P (Signed)
[facility] History and Physical  Rocio Roam ZOX:[id number] DOB: 14-Apr-1966 DOA: 06/09/2015  Referring physician: Ms.Westfall. PCP: No PCP Per Patient  Specialists: NONE.  Chief Complaint: Painful second toe of the right foot.  HPI: Linda Poole is a 50 y.o. female with history of diabetes mellitus type 2 as not been compliant with her medications presents to the ER because of increasing pain involving the right second toe. Patient's symptoms started 3 days ago. Denies any trauma or insect bites. Has progressively worsened with increasing swelling and pain and erythema moving towards to the proximal foot. In the ER patient was found to be tachycardic febrile and blood sugars were in the 600 with no anion gap. X-rays did not show any bony involvement. On exam patient has good distal foot pulses. Patient was started on empiric antibiotics and on IV insulin infusion and admitted for further management. Denies any chest pain or shortness of breath. Has had paronychia involving the left index finger over the last 3 weeks.   Review of Systems: As presented in the history of presenting illness, rest negative.  Past Medical History  Diagnosis Date  . Diabetes mellitus without complication [facility])    Past Surgical History  Procedure Laterality Date  . Tubal ligation     Social History:  reports that she has been smoking.  She does not have any smokeless tobacco history on file. She reports that she does not drink alcohol or use illicit drugs. Where does patient live home. Can patient participate in ADLs? Yes.  No Known Allergies  Family History:  Family History  Problem Relation Age of Onset  . Hypertension Mother   . Stroke Mother   . Diabetes Mellitus II Father       Prior to Admission medications   Medication Sig Start Date End Date Taking? Authorizing Provider  ibuprofen (ADVIL,MOTRIN) 200 MG tablet Take 1,200 mg by mouth every 4 (four) hours as needed (pain). For pain   Yes  Historical Provider, MD  metFORMIN (GLUCOPHAGE) 500 MG tablet Take 1 tablet (500 mg total) by mouth 2 (two) times daily with a meal. 05/25/15  Yes Doug Sou, MD  methadone (DOLOPHINE) 10 MG/5ML solution Take 120 mg by mouth every morning.   Yes Historical Provider, MD    Physical Exam: Filed Vitals:   06/09/15 1853  BP: 154/105  Pulse: 125  Temp: 98.8 F (37.1 C)  TempSrc: Oral  Resp: 18  SpO2: 97%     General:  Moderately built and nourished.  Eyes: Anicteric no pallor. ENT: No discharge from the ears eyes nose or mouth. Chest - no rhonchi or crepitations. Heart - S1 and S2 heard. Abdomen - soft nontender bowel sounds present. Skin - erythema over the right foot with right second toe inflamed and swollen. There is also swelling of the left index finger. Involving the distal phalanx. Musculoskeletal - as described in the skin section. Neurology - alert awake oriented to time place and person. Moves all extremities. Psychiatric - appears normal.     Recent Labs Lab 06/09/15 1915  NA 127*  K 3.5  CL 84*  CO2 27  GLUCOSE 662*  BUN <5*  CREATININE 0.72  CALCIUM 10.1   Liver Function Tests:  Recent Labs Lab 06/09/15 1915  AST 15  ALT 15  ALKPHOS 167*  BILITOT 0.5  PROT 8.2*  ALBUMIN 3.6   No results for input(s): LIPASE, AMYLASE in the last 168 hours. No results for input(s): AMMONIA in the last 168  hours. CBC:  Recent Labs Lab 06/09/15 1915  WBC 27.5*  NEUTROABS 22.2*  HGB 13.9  HCT 40.8  MCV 89.5  PLT 339   Cardiac Enzymes: No results for input(s): CKTOTAL, CKMB, CKMBINDEX, TROPONINI in the last 168 hours.  BNP (last 3 results) No results for input(s): BNP in the last 8760 hours.  ProBNP (last 3 results) No results for input(s): PROBNP in the last 8760 hours.  CBG:  Recent Labs Lab 06/09/15 1858 06/09/15 2034 06/09/15 2128  GLUCAP >600* 572* 470*    Radiological Exams on Admission: Dg Foot Complete Right  06/09/2015   CLINICAL DATA:  Injury to right foot 2 days ago with swelling, erythema and pain. Initial encounter. EXAM: RIGHT FOOT COMPLETE - 3+ VIEW COMPARISON:  None. FINDINGS: There is no evidence of fracture or dislocation. There is no evidence of arthropathy or other focal bone abnormality. Soft tissues are unremarkable. IMPRESSION: Negative. Electronically Signed   By: Irish LackGlenn  Yamagata M.D.   On: 06/09/2015 19:49     Assessment/Plan Principal Problem:   Sepsis (HCC) Active Problems:   Diabetic foot infection (HCC)   Hyperosmolar non-ketotic state in patient with type 2 diabetes mellitus (HCC)   1. Sepsis from cellulitis involving the right foot second toe - at this time I have ordered MRI of the right foot to rule out osteomyelitis. Check sedimentation rate and follow blood cultures and patient has been placed on vancomycin and Zosyn. Patient may need orthopedic consult in a.m. Patient also has left hand paranoid For which I have ordered left hand x-rays to rule out bony involvement. Follow lactic acid levels and procalcitonin levels and continue with aggressive hydration. Patient has received so far 4 L of normal saline bolus. 2. Hyperosmolar nonketotic diabetes mellitus type 2 - secondary to noncompliance with medications. Check hemoglobin A1c. If patient's blood sugar decreases less than 250 I'm going to place patient on Lantus 15 units subcutaneous with moderate dose sliding scale coverage. 3. Tobacco abuse - advised to quit smoking.   DVT Prophylaxis SCDs anticipation of possible procedure.  Code Status: Full code.   Family Communication: Discussed with patient.  Disposition Plan: Admit to inpatient.    Saquan Furtick N. [facility] Pager [phone].  If 7PM-7AM, please contact night-coverage [email or web address] Password [email or web address] 06/09/2015, 11:22 PM

## 2015-06-09 NOTE — ED Provider Notes (Signed)
  Face-to-face evaluation   History: She complains of gradually worsening swelling, and right foot associated with a toe infection. Similar recent infection in finger of left hand. She completed antibiotics 2 weeks ago.  Physical exam: Alert, tearful, anxious. Right foot, tender and swollen with reddening. Right second toe is very swollen, appears to be infected, with possible ischemia and subcutaneous pus.  Medical screening examination/treatment/procedure(s) were conducted as a shared visit with non-physician practitioner(s) and myself.  I personally evaluated the patient during the encounter  Mancel BaleElliott Lanise Mergen, MD 06/11/15 [phone]

## 2015-06-09 NOTE — Progress Notes (Signed)
ANTIBIOTIC CONSULT NOTE - INITIAL  Pharmacy Consult for vancomycin Indication: diabetic foot infection  No Known Allergies  Patient Measurements:   Adjusted Body Weight:   Vital Signs: Temp: 98.8 F (37.1 C) (01/10 1853) Temp Source: Oral (01/10 1853) BP: 154/105 mmHg (01/10 1853) Pulse Rate: 125 (01/10 1853) Intake/Output from previous day:   Intake/Output from this shift:    Labs:  Recent Labs  06/09/15 1915  WBC 27.5*  HGB 13.9  PLT 339  CREATININE 0.72   CrCl cannot be calculated (Unknown ideal weight.). No results for input(s): VANCOTROUGH, VANCOPEAK, VANCORANDOM, GENTTROUGH, GENTPEAK, GENTRANDOM, TOBRATROUGH, TOBRAPEAK, TOBRARND, AMIKACINPEAK, AMIKACINTROU, AMIKACIN in the last 72 hours.   Microbiology: No results found for this or any previous visit (from the past 720 hour(s)).  Medical History: Past Medical History  Diagnosis Date  . Diabetes mellitus without complication (HCC)     Assessment: 49 yof with wound to 2nd digit on R foot, swelling, redness. Pharmacy consulted to dose vancomycin for diabetic foot infection, high MRSA risk per MD. Afeb, wbc 27.5, SCr 0.72, CrCl~61 on admit. CTX/flagyl ordered per MD in ED. Recently treated with keflex for similar wound to R finger.  1/10 vanc>> 1/10 CTX>> 1/10 flagyl>>  Goal of Therapy:  Vancomycin trough level 15-20 mcg/ml  Plan:  Vanc 1250mg  IV x1; then Vanc 750mg  IV q12h CTX 2g IV q24h per MD Flagyl 500mg  IV q8h per MD Monitor clinical progress, c/s, renal function, abx plan/LOT VT@SS  as indicated  Babs BertinHaley Demone Lyles, PharmD, [facility] Clinical Pharmacist Pager [phone] 06/09/2015 8:40 PM

## 2015-06-09 NOTE — ED Provider Notes (Signed)
CSN: [id number]     Arrival date & time 06/09/15  1847 History   First MD Initiated Contact with Patient 06/09/15 2011     Chief Complaint  Patient presents with  . Wound Check  . Hyperglycemia    HPI   Linda Poole is a 50 y.o. female with a PMH of DM who presents to the ED with right 2nd toe pain and swelling, which she states started Sunday and has progressively worsened since that time. She reports her pain is constant and feels like someone is stabbing her. She states movement exacerbates her pain. She reports elevating her right lower extremity has provided minimal symptom relief. She denies fever, chills, abdominal pain, N/V, numbness, weakness, paresthesia. She states her blood sugar has been elevated recently, though she reports she has been taking her medications as directed. She was recently treated with keflex for paronychia to her left 2nd finger.    Past Medical History  Diagnosis Date  . Diabetes mellitus without complication (HCC)    History reviewed. No pertinent past surgical history. History reviewed. No pertinent family history. Social History  Substance Use Topics  . Smoking status: Current Every Day Smoker -- 1.00 packs/day  . Smokeless tobacco: None  . Alcohol Use: No   OB History    No data available      Review of Systems  Constitutional: Negative for fever and chills.  Gastrointestinal: Negative for nausea, vomiting and abdominal pain.  Musculoskeletal: Positive for arthralgias.  Skin: Positive for wound.  Neurological: Negative for weakness and numbness.  All other systems reviewed and are negative.     Allergies  Review of patient's allergies indicates no known allergies.  Home Medications   Prior to Admission medications   Medication Sig Start Date End Date Taking? Authorizing Provider  ibuprofen (ADVIL,MOTRIN) 200 MG tablet Take 1,200 mg by mouth every 4 (four) hours as needed (pain). For pain   Yes Historical Provider, MD  metFORMIN  (GLUCOPHAGE) 500 MG tablet Take 1 tablet (500 mg total) by mouth 2 (two) times daily with a meal. 05/25/15  Yes Doug Sou, MD  methadone (DOLOPHINE) 10 MG/5ML solution Take 120 mg by mouth every morning.   Yes Historical Provider, MD    BP 154/105 mmHg  Pulse 125  Temp(Src) 98.8 F (37.1 C) (Oral)  Resp 18  SpO2 97% Physical Exam  Constitutional: She is oriented to person, place, and time. She appears well-developed and well-nourished. No distress.  HENT:  Head: Normocephalic and atraumatic.  Right Ear: External ear normal.  Left Ear: External ear normal.  Nose: Nose normal.  Mouth/Throat: Uvula is midline, oropharynx is clear and moist and mucous membranes are normal.  Eyes: Conjunctivae, EOM and lids are normal. Pupils are equal, round, and reactive to light. Right eye exhibits no discharge. Left eye exhibits no discharge. No scleral icterus.  Neck: Normal range of motion. Neck supple.  Cardiovascular: Normal rate, regular rhythm, normal heart sounds, intact distal pulses and normal pulses.   Pulmonary/Chest: Effort normal and breath sounds normal. No respiratory distress. She has no wheezes. She has no rales.  Abdominal: Soft. Normal appearance and bowel sounds are normal. She exhibits no distension and no mass. There is no tenderness. There is no rigidity, no rebound and no guarding.  Musculoskeletal: She exhibits edema and tenderness.  Erythema and edema to right 2nd toe with decreased ROM due to pain. Erythema extends to dorsum of foot. Sensation to light touch intact. Distal pulses intact.  Neurological:  She is alert and oriented to person, place, and time. She has normal strength. No sensory deficit.  Skin: Skin is warm, dry and intact. No rash noted. She is not diaphoretic. No erythema. No pallor.  Psychiatric: She has a normal mood and affect. Her speech is normal and behavior is normal.  Nursing note and vitals reviewed.   ED Course  Procedures (including critical care  time)  Labs Review Labs Reviewed  CBC WITH DIFFERENTIAL/PLATELET - Abnormal; Notable for the following:    WBC 27.5 (*)    Neutro Abs 22.2 (*)    Monocytes Absolute 2.5 (*)    All other components within normal limits  COMPREHENSIVE METABOLIC PANEL - Abnormal; Notable for the following:    Sodium 127 (*)    Chloride 84 (*)    Glucose, Bld 662 (*)    BUN <5 (*)    Total Protein 8.2 (*)    Alkaline Phosphatase 167 (*)    Anion gap 16 (*)    All other components within normal limits  URINALYSIS, ROUTINE W REFLEX MICROSCOPIC (NOT AT [facility]) - Abnormal; Notable for the following:    Specific Gravity, Urine 1.038 (*)    Glucose, UA >1000 (*)    All other components within normal limits  URINE MICROSCOPIC-ADD ON - Abnormal; Notable for the following:    Squamous Epithelial / LPF 0-5 (*)    Bacteria, UA RARE (*)    All other components within normal limits  CBG MONITORING, ED - Abnormal; Notable for the following:    Glucose-Capillary >600 (*)    All other components within normal limits  I-STAT CG4 LACTIC ACID, ED - Abnormal; Notable for the following:    Lactic Acid, Venous 3.83 (*)    All other components within normal limits  CBG MONITORING, ED - Abnormal; Notable for the following:    Glucose-Capillary 572 (*)    All other components within normal limits  CBG MONITORING, ED - Abnormal; Notable for the following:    Glucose-Capillary 470 (*)    All other components within normal limits  CULTURE, BLOOD (ROUTINE X 2)  CULTURE, BLOOD (ROUTINE X 2)  PREGNANCY, URINE  SEDIMENTATION RATE  URIC ACID  HEMOGLOBIN A1C  PROCALCITONIN  I-STAT CG4 LACTIC ACID, ED    Imaging Review Dg Foot Complete Right  06/09/2015  CLINICAL DATA:  Injury to right foot 2 days ago with swelling, erythema and pain. Initial encounter. EXAM: RIGHT FOOT COMPLETE - 3+ VIEW COMPARISON:  None. FINDINGS: There is no evidence of fracture or dislocation. There is no evidence of arthropathy or other focal bone  abnormality. Soft tissues are unremarkable. IMPRESSION: Negative. Electronically Signed   By: Irish Lack M.D.   On: 06/09/2015 19:49   I have personally reviewed and evaluated these images and lab results as part of my medical decision-making.   EKG Interpretation None      MDM   Final diagnoses:  Diabetic foot infection [facility])  Hyperglycemia    50 year old female presents with right 2nd toe pain and swelling since Sunday. Reports she recently injured her toe, and that her injury precipitated her symptoms.   Patient is afebrile. Tachycardic. No hypotension. Erythema and edema to right 2nd toe with TTP and decreased ROM due to pain. Erythema extends to dorsum of foot. Sensation to light touch intact. Distal pulses intact.   CBC remarkable for leukocytosis of 27.5. CMP remarkable for sodium 127, glucose 662, alkaline phosphatase 167, anion gap 16. UA negative  for infection. Lactic acid elevated at 3.83. Imaging of right foot negative for fracture, dislocation, arthropathy, focal bone abnormality.  Started antibiotics per diabetic foot infection protocol (rocephin, flagyl, vancomycin). Started fluids and glucose stabilizer per DKA protocol.  Hospitalist consulted for admission. Spoke with Dr. Toniann FailKakrakandy, who will admit for further evaluation and management and advised additional fluid bolus administration.   Patient discussed with and seen by Dr. Effie ShyWentz.   BP 110/74 mmHg  Pulse 91  Temp(Src) 98.8 F (37.1 C) (Oral)  Resp 18  SpO2 98%     Mady Gemmalizabeth C Westfall, PA-C 06/10/15 0039  Mancel BaleElliott Wentz, MD 06/11/15 [phone]

## 2015-06-09 NOTE — ED Notes (Signed)
Pt here for wound to second digit on right foot with swelling and redness; pt with hx of DM

## 2015-06-10 ENCOUNTER — Inpatient Hospital Stay (HOSPITAL_COMMUNITY): Payer: Self-pay

## 2015-06-10 DIAGNOSIS — E1169 Type 2 diabetes mellitus with other specified complication: Secondary | ICD-10-CM

## 2015-06-10 DIAGNOSIS — A419 Sepsis, unspecified organism: Principal | ICD-10-CM

## 2015-06-10 DIAGNOSIS — L089 Local infection of the skin and subcutaneous tissue, unspecified: Secondary | ICD-10-CM

## 2015-06-10 DIAGNOSIS — E1101 Type 2 diabetes mellitus with hyperosmolarity with coma: Secondary | ICD-10-CM

## 2015-06-10 LAB — CBC WITH DIFFERENTIAL/PLATELET
Basophils Absolute: 0 10*3/uL (ref 0.0–0.1)
Basophils Relative: 0 %
Eosinophils Absolute: 0.1 10*3/uL (ref 0.0–0.7)
Eosinophils Relative: 1 %
HEMATOCRIT: 31.5 % — AB (ref 36.0–46.0)
HEMOGLOBIN: 10.4 g/dL — AB (ref 12.0–15.0)
LYMPHS ABS: 3.6 10*3/uL (ref 0.7–4.0)
LYMPHS PCT: 17 %
MCH: 29.4 pg (ref 26.0–34.0)
MCHC: 33 g/dL (ref 30.0–36.0)
MCV: 89 fL (ref 78.0–100.0)
Monocytes Absolute: 1.6 10*3/uL — ABNORMAL HIGH (ref 0.1–1.0)
Monocytes Relative: 8 %
NEUTROS ABS: 15.9 10*3/uL — AB (ref 1.7–7.7)
NEUTROS PCT: 75 %
Platelets: 298 10*3/uL (ref 150–400)
RBC: 3.54 MIL/uL — AB (ref 3.87–5.11)
RDW: 13.6 % (ref 11.5–15.5)
WBC: 21.2 10*3/uL — AB (ref 4.0–10.5)

## 2015-06-10 LAB — GLUCOSE, CAPILLARY
GLUCOSE-CAPILLARY: 341 mg/dL — AB (ref 65–99)
GLUCOSE-CAPILLARY: 347 mg/dL — AB (ref 65–99)
Glucose-Capillary: 244 mg/dL — ABNORMAL HIGH (ref 65–99)
Glucose-Capillary: 167 mg/dL — ABNORMAL HIGH (ref 65–99)

## 2015-06-10 LAB — BASIC METABOLIC PANEL
Anion gap: 16 — ABNORMAL HIGH (ref 5–15)
BUN: <5 mg/dL — ABNORMAL LOW (ref 6–20)
CALCIUM: 9.8 mg/dL (ref 8.9–10.3)
CO2: 27 mmol/L (ref 22–32)
CREATININE: 0.55 mg/dL (ref 0.44–1.00)
Chloride: 87 mmol/L — ABNORMAL LOW (ref 101–111)
GFR calc Af Amer: >60 mL/min (ref >60)
GLUCOSE: 527 mg/dL — AB (ref 65–99)
Potassium: 3.3 mmol/L — ABNORMAL LOW (ref 3.5–5.1)
Sodium: 130 mmol/L — ABNORMAL LOW (ref 135–145)

## 2015-06-10 LAB — COMPREHENSIVE METABOLIC PANEL
ALK PHOS: 117 U/L (ref 38–126)
ALT: 12 U/L — ABNORMAL LOW (ref 14–54)
ANION GAP: 7 (ref 5–15)
AST: 17 U/L (ref 15–41)
Albumin: 2.3 g/dL — ABNORMAL LOW (ref 3.5–5.0)
BUN: <5 mg/dL — ABNORMAL LOW (ref 6–20)
CALCIUM: 7.9 mg/dL — AB (ref 8.9–10.3)
CO2: 26 mmol/L (ref 22–32)
Chloride: 102 mmol/L (ref 101–111)
Creatinine, Ser: 0.36 mg/dL — ABNORMAL LOW (ref 0.44–1.00)
GFR calc non Af Amer: >60 mL/min (ref >60)
Glucose, Bld: 302 mg/dL — ABNORMAL HIGH (ref 65–99)
POTASSIUM: 3.1 mmol/L — AB (ref 3.5–5.1)
SODIUM: 135 mmol/L (ref 135–145)
Total Bilirubin: 0.4 mg/dL (ref 0.3–1.2)
Total Protein: 5.6 g/dL — ABNORMAL LOW (ref 6.5–8.1)

## 2015-06-10 LAB — LACTIC ACID, PLASMA
Lactic Acid, Venous: 2.3 mmol/L (ref 0.5–2.0)
Lactic Acid, Venous: 1.6 mmol/L (ref 0.5–2.0)

## 2015-06-10 LAB — PREGNANCY, URINE: PREG TEST UR: NEGATIVE

## 2015-06-10 MED ORDER — METHADONE HCL 40 MG PO TBSO: 120.0000 mg | ORAL_TABLET | Freq: Every day | ORAL | Status: DC

## 2015-06-10 MED ORDER — METHADONE HCL 10 MG/5ML PO SOLN: 120.0000 mg | Freq: Every morning | ORAL | Status: DC

## 2015-06-10 MED ORDER — INSULIN STARTER KIT- SYRINGES (ENGLISH)
1.0000 | Freq: Once | Status: AC
  Administered 2015-06-11: 1
  Filled 2015-06-10: qty 1

## 2015-06-10 MED ORDER — INSULIN ASPART 100 UNIT/ML ~~LOC~~ SOLN
0.0000 [IU] | Freq: Three times a day (TID) | SUBCUTANEOUS | Status: DC
Start: 2015-06-11 — End: 2015-06-15
  Administered 2015-06-11: 15 [IU] via SUBCUTANEOUS
  Administered 2015-06-11 – 2015-06-12 (×2): 3 [IU] via SUBCUTANEOUS
  Administered 2015-06-12 – 2015-06-13 (×2): 5 [IU] via SUBCUTANEOUS
  Administered 2015-06-13 – 2015-06-14 (×5): 3 [IU] via SUBCUTANEOUS
  Administered 2015-06-15: 8 [IU] via SUBCUTANEOUS
  Administered 2015-06-15: 2 [IU] via SUBCUTANEOUS

## 2015-06-10 MED ORDER — PIPERACILLIN-TAZOBACTAM 3.375 G IVPB
3.3750 g | Freq: Once | INTRAVENOUS | Status: AC
  Administered 2015-06-10: 3.375 g via INTRAVENOUS
  Filled 2015-06-10: qty 50

## 2015-06-10 MED ORDER — MORPHINE SULFATE (PF) 2 MG/ML IV SOLN
1.0000 mg | INTRAVENOUS | Status: DC | PRN
  Administered 2015-06-10 (×4): 1 mg via INTRAVENOUS
  Filled 2015-06-10 (×4): qty 1

## 2015-06-10 MED ORDER — INSULIN REGULAR BOLUS VIA INFUSION: 0.0000 [IU] | Freq: Three times a day (TID) | INTRAVENOUS | Status: DC

## 2015-06-10 MED ORDER — DEXTROSE 50 % IV SOLN: 25.0000 mL | INTRAVENOUS | Status: DC | PRN

## 2015-06-10 MED ORDER — INSULIN GLARGINE 100 UNIT/ML ~~LOC~~ SOLN
15.0000 [IU] | Freq: Every day | SUBCUTANEOUS | Status: DC
  Administered 2015-06-10 (×2): 15 [IU] via SUBCUTANEOUS
  Filled 2015-06-10 (×3): qty 0.15

## 2015-06-10 MED ORDER — SODIUM CHLORIDE 0.9 % IV SOLN
INTRAVENOUS | Status: DC
Start: 2015-06-10 — End: 2015-06-10
  Administered 2015-06-10: 03:00:00 via INTRAVENOUS

## 2015-06-10 MED ORDER — SODIUM CHLORIDE 0.9 % IV SOLN
INTRAVENOUS | Status: DC
  Administered 2015-06-10 (×2): via INTRAVENOUS

## 2015-06-10 MED ORDER — METHADONE HCL 10 MG PO TABS
120.0000 mg | ORAL_TABLET | Freq: Every day | ORAL | Status: DC
  Administered 2015-06-10 – 2015-06-11 (×2): 120 mg via ORAL
  Filled 2015-06-10: qty 12

## 2015-06-10 MED ORDER — ONDANSETRON HCL 4 MG/2ML IJ SOLN: 4.0000 mg | Freq: Four times a day (QID) | INTRAMUSCULAR | Status: DC | PRN

## 2015-06-10 MED ORDER — POTASSIUM CHLORIDE CRYS ER 20 MEQ PO TBCR
40.0000 meq | EXTENDED_RELEASE_TABLET | Freq: Once | ORAL | Status: AC
  Administered 2015-06-10: 40 meq via ORAL
  Filled 2015-06-10: qty 2

## 2015-06-10 MED ORDER — ACETAMINOPHEN 325 MG PO TABS
650.0000 mg | ORAL_TABLET | Freq: Four times a day (QID) | ORAL | Status: DC | PRN
  Administered 2015-06-10 – 2015-06-11 (×4): 650 mg via ORAL
  Filled 2015-06-10 (×4): qty 2

## 2015-06-10 MED ORDER — ONDANSETRON HCL 4 MG PO TABS: 4.0000 mg | ORAL_TABLET | Freq: Four times a day (QID) | ORAL | Status: DC | PRN

## 2015-06-10 MED ORDER — SODIUM CHLORIDE 0.9 % IV SOLN: INTRAVENOUS | Status: DC

## 2015-06-10 MED ORDER — PIPERACILLIN-TAZOBACTAM 3.375 G IVPB
3.3750 g | Freq: Three times a day (TID) | INTRAVENOUS | Status: DC
  Administered 2015-06-10 – 2015-06-15 (×15): 3.375 g via INTRAVENOUS
  Filled 2015-06-10 (×20): qty 50

## 2015-06-10 MED ORDER — NICOTINE 21 MG/24HR TD PT24
21.0000 mg | MEDICATED_PATCH | Freq: Every day | TRANSDERMAL | Status: DC
  Administered 2015-06-10 – 2015-06-14 (×5): 21 mg via TRANSDERMAL
  Filled 2015-06-10 (×6): qty 1

## 2015-06-10 MED ORDER — LIVING WELL WITH DIABETES BOOK
Freq: Once | Status: AC
  Administered 2015-06-10: 23:00:00
  Filled 2015-06-10: qty 1

## 2015-06-10 MED ORDER — INSULIN ASPART 100 UNIT/ML ~~LOC~~ SOLN
0.0000 [IU] | Freq: Three times a day (TID) | SUBCUTANEOUS | Status: DC
  Administered 2015-06-10: 11 [IU] via SUBCUTANEOUS
  Administered 2015-06-10: 5 [IU] via SUBCUTANEOUS
  Administered 2015-06-10: 3 [IU] via SUBCUTANEOUS

## 2015-06-10 MED ORDER — ACETAMINOPHEN 650 MG RE SUPP: 650.0000 mg | Freq: Four times a day (QID) | RECTAL | Status: DC | PRN

## 2015-06-10 MED ORDER — MORPHINE SULFATE (PF) 4 MG/ML IV SOLN
3.0000 mg | INTRAVENOUS | Status: DC | PRN
  Administered 2015-06-10 – 2015-06-13 (×16): 3 mg via INTRAVENOUS
  Filled 2015-06-10 (×16): qty 1

## 2015-06-10 MED ORDER — INSULIN ASPART 100 UNIT/ML ~~LOC~~ SOLN
0.0000 [IU] | Freq: Every day | SUBCUTANEOUS | Status: DC
  Administered 2015-06-10: 4 [IU] via SUBCUTANEOUS
  Administered 2015-06-11: 3 [IU] via SUBCUTANEOUS
  Administered 2015-06-12 – 2015-06-13 (×2): 2 [IU] via SUBCUTANEOUS

## 2015-06-10 NOTE — Progress Notes (Signed)
Pharmacy Antibiotic Follow-up Note Linda RocheJulie Poole is a 50 y.o. year-old female admitted on 06/09/2015. The patient is currently on day 1 of Zosyn and vancomycin for osteomyelitis of the distal phalanx of the second toe with adjacent cellulitis in setting of DM.  Assessment/Plan: 1. Continue EI Zosyn 3.375 gram every 8 hours as already ordered 2. Vancomycin 750 mg q 12 hours 3. [facility]; goal 15-20 4. Following daily; notes prn  Temp (24hrs), Avg:98.9 F (37.2 C), Min:98.7 F (37.1 C), Max:99.2 F (37.3 C)   Recent Labs Lab 06/09/15 1915 06/10/15 0515  WBC 27.5* 21.2*    Recent Labs Lab 06/09/15 1915 06/09/15 2115 06/10/15 0515  CREATININE 0.72 0.55 0.36*   CrCl cannot be calculated (Unknown ideal weight.).    No Known Allergies  Antimicrobials this admission: 1/10 Ceftriaxone x 1 dose 1/10 Metronidazole x 1 dose  1/10 vancomycin >>  1/11 Zosyn >>   Levels/dose changes this admission: None to date   Microbiology results: 1/10 BCx: px   Thank you for allowing pharmacy to be a part of this patient's care.  Pollyann SamplesAndy Jeyson Deshotel, PharmD, BCPS 06/10/2015, 10:54 AM Pager: [phone]

## 2015-06-10 NOTE — Progress Notes (Addendum)
PROGRESS NOTE    Linda Poole:[id number] DOB: 01/11/1966 DOA: 06/09/2015 PCP: No PCP Per Patient  HPI/Brief narrative 50 year old female patient with history of DM 2, noncompliant with medications, tobacco abuse, presented with painful right second toe. In the ED, tachycardic, febrile, blood glucose in the 600 range without increased anion gap. MRI foot confirmed right second toe osteomyelitis. Orthopedics consulted.   Assessment/Plan:   Sepsis, present on admission, secondary to right second toe osteomyelitis/cellulitis - Continue IV vancomycin and Zosyn - Orthopedics/Dr. Sharol Given consulted-input pending.  Left second finger paronychia - No acute findings on clinical exam or x-ray.  Uncontrolled type II DM with hyperosmolar state - Secondary to noncompliance with medications. - Briefly placed on insulin drip and then transitioned to Lantus and SSI. These will need titration as needed. Follow A1c.  Tobacco abuse - Cessation counseled.  Hypokalemia - Replace and follow.  Anemia - Follow CBCs  Hyponatremia - Secondary to dehydration and hyperglycemia. Improved. Follow BMP  Chronic Methadone Rx    DVT prophylaxis: SCDs Code Status: Full Family Communication: Discussed with sister at bedside Disposition Plan: DC home when medically stable.   Consultants:  Orthopedic/Dr. Sharol Given  Procedures:  None  Antimicrobials:  IV Zosyn 1/10 >  IV vancomycin 1/10 >   Subjective: Right second toe pain. Denies any other complaints.  Objective: Filed Vitals:   06/10/15 0000 06/10/15 0142 06/10/15 0238 06/10/15 1306  BP: 110/74 107/72 114/72 110/67  Pulse: 91 88 92 97  Temp:  99.2 F (37.3 C) 98.7 F (37.1 C)   TempSrc:  Rectal Oral   Resp:  18 18 18   SpO2: 98% 94% 97% 97%    Intake/Output Summary (Last 24 hours) at 06/10/15 1707 Last data filed at 06/10/15 1305  Gross per 24 hour  Intake 3768.75 ml  Output      0 ml  Net 3768.75 ml   There were no vitals filed  for this visit.  Exam:  General exam: Pleasant middle-aged female sitting up in bed, tearful but in no painful distress. Respiratory system: Clear. No increased work of breathing. Cardiovascular system: S1 & S2 heard, RRR. No JVD, murmurs, gallops, clicks or pedal edema. Gastrointestinal system: Abdomen is nondistended, soft and nontender. Normal bowel sounds heard. Central nervous system: Alert and oriented. No focal neurological deficits. Extremities: Symmetric 5 x 5 power. Right second toe swollen, black and blue discoloration, tender to touch and painful range of motion movements. Bounding dorsalis pedis. Please see pictures below.        Data Reviewed: Basic Metabolic Panel:  Recent Labs Lab 06/09/15 1915 06/09/15 2115 06/10/15 0515  NA 127* 130* 135  K 3.5 3.3* 3.1*  CL 84* 87* 102  CO2 27 27 26   GLUCOSE 662* 527* 302*  BUN <5* <5* <5*  CREATININE 0.72 0.55 0.36*  CALCIUM 10.1 9.8 7.9*   Liver Function Tests:  Recent Labs Lab 06/09/15 1915 06/10/15 0515  AST 15 17  ALT 15 12*  ALKPHOS 167* 117  BILITOT 0.5 0.4  PROT 8.2* 5.6*  ALBUMIN 3.6 2.3*   No results for input(s): LIPASE, AMYLASE in the last 168 hours. No results for input(s): AMMONIA in the last 168 hours. CBC:  Recent Labs Lab 06/09/15 1915 06/10/15 0515  WBC 27.5* 21.2*  NEUTROABS 22.2* 15.9*  HGB 13.9 10.4*  HCT 40.8 31.5*  MCV 89.5 89.0  PLT 339 298   Cardiac Enzymes: No results for input(s): CKTOTAL, CKMB, CKMBINDEX, TROPONINI in the last 168 hours. BNP (last  3 results) No results for input(s): PROBNP in the last 8760 hours. CBG:  Recent Labs Lab 06/09/15 2244 06/09/15 2359 06/10/15 0641 06/10/15 1144 06/10/15 1621  GLUCAP 302* 200* 244* 341* 167*    No results found for this or any previous visit (from the past 240 hour(s)).       Studies: Dg Hand 2 View Left  06/10/2015  CLINICAL DATA:  Follow-up left index finger cellulitis, history of diabetes EXAM: LEFT HAND  - 2 VIEW COMPARISON:  None. FINDINGS: Two views of the left hand submitted. No acute fracture or subluxation. No periosteal reaction or bony erosion. Mild soft tissue swelling noted distal aspect of the second finger. IMPRESSION: No acute fracture or subluxation. Mild soft tissue swelling second finger. No evidence of osteomyelitis. Electronically Signed   By: Lahoma Crocker M.D.   On: 06/10/2015 09:16   Mr Foot Right Wo Contrast  06/10/2015  CLINICAL DATA:  Pain and cellulitis of the second toe of the right foot. EXAM: MRI OF THE RIGHT FOREFOOT WITHOUT CONTRAST TECHNIQUE: Multiplanar, multisequence MR imaging was performed. No intravenous contrast was administered. COMPARISON:  Radiographs dated 06/09/2015 FINDINGS: There is destruction of the tuft of the distal phalanx of the second toe with abnormal signal throughout the distal phalangeal bone of the second toe with a soft tissue edema and swelling of the toe. Bandages in place. No joint effusions.  The other bones of the forefoot are normal. IMPRESSION: Osteomyelitis of the distal phalanx of the second toe with adjacent cellulitis. Electronically Signed   By: Lorriane Shire M.D.   On: 06/10/2015 09:32   Dg Foot Complete Right  06/09/2015  CLINICAL DATA:  Injury to right foot 2 days ago with swelling, erythema and pain. Initial encounter. EXAM: RIGHT FOOT COMPLETE - 3+ VIEW COMPARISON:  None. FINDINGS: There is no evidence of fracture or dislocation. There is no evidence of arthropathy or other focal bone abnormality. Soft tissues are unremarkable. IMPRESSION: Negative. Electronically Signed   By: Aletta Edouard M.D.   On: 06/09/2015 19:49        Scheduled Meds: . insulin aspart  0-15 Units Subcutaneous TID WC  . insulin glargine  15 Units Subcutaneous QHS  . insulin starter kit- syringes  1 kit Other Once  . living well with diabetes book   Does not apply Once  . methadone  120 mg Oral Daily  . nicotine  21 mg Transdermal Daily  .  piperacillin-tazobactam (ZOSYN)  IV  3.375 g Intravenous 3 times per day  . vancomycin  750 mg Intravenous Q12H   Continuous Infusions: . sodium chloride 125 mL/hr at 06/10/15 0232    Principal Problem:   Sepsis ([place]) Active Problems:   Diabetic foot infection ([place])   Hyperosmolar non-ketotic state in patient with type 2 diabetes mellitus ([place])    Time spent: 26 minutes    Livianna Petraglia, MD, FACP, FHM. [facility] Pager [phone]  If 7PM-7AM, please contact night-coverage [email or web address] Password [email or web address] 06/10/2015, 5:07 PM    LOS: 1 day

## 2015-06-10 NOTE — ED Notes (Signed)
Spoke to provider, d/c glucose stabilizer and give Lantis per orders.  Can go to tele bed

## 2015-06-10 NOTE — Progress Notes (Signed)
Utilization review completed.  

## 2015-06-10 NOTE — Progress Notes (Signed)
Inpatient Diabetes Program Recommendations  AACE/ADA: New Consensus Statement on Inpatient Glycemic Control (2015)  Target Ranges:  Prepandial:   less than 140 mg/dL      Peak postprandial:   less than 180 mg/dL (1-2 hours)      Critically ill patients:  140 - 180 mg/dL    Results for Jilda RocheRYCE, Laasya (MRN [id number]) as of 06/10/2015 09:51  Ref. Range 06/09/2015 18:58 06/09/2015 20:34 06/09/2015 21:28 06/09/2015 22:44 06/09/2015 23:59 06/10/2015 06:41  Glucose-Capillary Latest Ref Range: 65-99 mg/dL >865>600 (HH) 784572 (HH) 696470 (H) 302 (H) 200 (H) 244 (H)    Admit with: Sepsis/ Cellulitis from Toe Infection  History: DM  Home DM Meds: Metformin 500 mg bid  Current Insulin Orders: Lantus 15 units QHS      Novolog Moderate SSI (0-15 units) TID AC    -Current A1c level pending.  -Patient does not have PCP nor health insurance.  Will need to keep cost considerations in mind when prescribing meds for home.  -Spoke with patient this AM.  Patient was in a lot of pain and had a hard time focusing on our conversation.  Sister of patient also at bedside and frequently interrupted me during my conversation with patient.  Patient stated she has been taking Metformin 500 mg bid at home but that she was supposed to be taking 1000 mg bid however the pharmacy gave her the wrong prescription??  Discussed with patient that she may likely need insulin for home.  Spoke with patient about the A1c test we have drawn.  Explained what an A1c is and what it measures.  Reminded patient that her goal A1c is 7% or less per ADA standards to prevent both acute and long-term complications.  Explained to patient the extreme importance of good glucose control at home.  Encouraged patient to check her CBGs at least bid at home (fasting and another check within the day) and to record all CBGs in a logbook for her PCP to review.  Note patient will be following up and the [facility] on 06/17/15 for primary  care.  -Patient also had questions about diabetes diet.  Discussed basic DM diet information with patient.  Encouraged patient to avoid beverages with sugar (regular soda, sweet tea, lemonade, fruit juice) and to consume mostly water.  Discussed what foods contain carbohydrates and how carbohydrates affect the body's blood sugar levels.  Encouraged patient to be careful with her portion sizes (especially grains, starchy vegetables, and fruits).  Explained to patient that women should have 45-60 grams of carbohydrates per meal per day.  Discussed basic carbohydrate counting with patient and how to read a food label.   Will have RD follow up with patient when she is more amenable to learning as she is in a lot of pain right now.  -Will also have RN start working with patient on insulin teaching just in case decision is made to send pt home on insulin.  Patient will need an affordable regimen as she does not have health insurance.       Recommend the following:  1. Would leave patient on Lantus and Novolog while hospitalized to get glucose levels under good control  2. At time of discharge recommend we switch patient to 70/30 insulin (if patient needs insulin at d/c).  Patient can purchase Reli[facility] brand 70/30 [facility] per vial with a physician's Rx.  3. If patient continues to have elevated postprandial glucose levels, recommend initiation of  Novolog Meal Coverage- Novolog 4 units tidwc     --Will follow patient during hospitalization--  Ambrose Finland RN, MSN, CDE Diabetes Coordinator Inpatient Glycemic Control Team Team Pager: [phone] (8a-5p)

## 2015-06-11 ENCOUNTER — Other Ambulatory Visit (HOSPITAL_COMMUNITY): Payer: Self-pay | Admitting: Orthopedic Surgery

## 2015-06-11 LAB — GLUCOSE, CAPILLARY
GLUCOSE-CAPILLARY: 399 mg/dL — AB (ref 65–99)
GLUCOSE-CAPILLARY: 276 mg/dL — AB (ref 65–99)
Glucose-Capillary: 194 mg/dL — ABNORMAL HIGH (ref 65–99)
Glucose-Capillary: 96 mg/dL (ref 65–99)

## 2015-06-11 LAB — BASIC METABOLIC PANEL
ANION GAP: 4 — AB (ref 5–15)
BUN: 5 mg/dL — ABNORMAL LOW (ref 6–20)
CO2: 28 mmol/L (ref 22–32)
Calcium: 8.5 mg/dL — ABNORMAL LOW (ref 8.9–10.3)
Chloride: 107 mmol/L (ref 101–111)
Creatinine, Ser: 0.42 mg/dL — ABNORMAL LOW (ref 0.44–1.00)
GFR calc Af Amer: >60 mL/min (ref >60)
Glucose, Bld: 271 mg/dL — ABNORMAL HIGH (ref 65–99)
POTASSIUM: 3.9 mmol/L (ref 3.5–5.1)
SODIUM: 139 mmol/L (ref 135–145)

## 2015-06-11 LAB — PROCALCITONIN: PROCALCITONIN: 0.16 ng/mL

## 2015-06-11 LAB — HEMOGLOBIN A1C
Hgb A1c MFr Bld: 15.8 % — ABNORMAL HIGH (ref 4.8–5.6)
Mean Plasma Glucose: 407 mg/dL

## 2015-06-11 LAB — MAGNESIUM: MAGNESIUM: 2.1 mg/dL (ref 1.7–2.4)

## 2015-06-11 MED ORDER — METHADONE HCL 10 MG PO TABS
120.0000 mg | ORAL_TABLET | ORAL | Status: DC
  Administered 2015-06-12 – 2015-06-14 (×3): 120 mg via ORAL
  Administered 2015-06-15: 10 mg via ORAL
  Administered 2015-06-15: 110 mg via ORAL
  Filled 2015-06-11 (×6): qty 12

## 2015-06-11 MED ORDER — INSULIN ASPART 100 UNIT/ML ~~LOC~~ SOLN
4.0000 [IU] | Freq: Three times a day (TID) | SUBCUTANEOUS | Status: DC
  Administered 2015-06-11 – 2015-06-15 (×9): 4 [IU] via SUBCUTANEOUS

## 2015-06-11 MED ORDER — INSULIN GLARGINE 100 UNIT/ML ~~LOC~~ SOLN
20.0000 [IU] | Freq: Every day | SUBCUTANEOUS | Status: DC
  Administered 2015-06-11 – 2015-06-14 (×4): 20 [IU] via SUBCUTANEOUS
  Filled 2015-06-11 (×7): qty 0.2

## 2015-06-11 MED ORDER — DOCUSATE SODIUM 100 MG PO CAPS
100.0000 mg | ORAL_CAPSULE | Freq: Two times a day (BID) | ORAL | Status: DC
  Administered 2015-06-11 – 2015-06-15 (×7): 100 mg via ORAL
  Filled 2015-06-11 (×7): qty 1

## 2015-06-11 MED ORDER — INFLUENZA VAC SPLIT QUAD 0.5 ML IM SUSY
0.5000 mL | PREFILLED_SYRINGE | INTRAMUSCULAR | Status: AC
  Administered 2015-06-13: 0.5 mL via INTRAMUSCULAR
  Filled 2015-06-11: qty 0.5

## 2015-06-11 NOTE — Progress Notes (Signed)
PROGRESS NOTE    Linda Poole IWP:[id number] DOB: 1965-08-07 DOA: 06/09/2015 PCP: No PCP Per Patient  HPI/Brief narrative 50 year old female patient with history of DM 2, noncompliant with medications, tobacco abuse, presented with painful right second toe. In the ED, tachycardic, febrile, blood glucose in the 600 range without increased anion gap. MRI foot confirmed right second toe osteomyelitis. Orthopedics consulted and plan surgery on 1/13.   Assessment/Plan:   Sepsis, present on admission, secondary to right second toe osteomyelitis/cellulitis - Continue IV vancomycin and Zosyn - Orthopedics/Dr. Sharol Given consulted and plans right foot secondary amputation on 1/13 after known. He recommends continued IV antibiotics for at least 48 hours postoperatively and then discharged home to follow-up with him in the office in one week postoperatively.  Left second finger paronychia - No acute findings on clinical exam or x-ray.  Uncontrolled type II DM with hyperosmolar state - Secondary to noncompliance with medications. - Briefly placed on insulin drip and then transitioned to Lantus and SSI. These will need titration as needed. - A1c: 15.8. We will adjust Lantus, SSI and add mealtime NovoLog. Will need to be discharged on insulin.  Tobacco abuse - Cessation counseled.  Hypokalemia - Replaced.  Anemia - Follow CBCs  Hyponatremia - Secondary to dehydration and hyperglycemia. Improved. Follow BMP  Chronic Methadone Rx  - High threshold for pain. We will adjust pain medications. - Has been on methadone due to history of prescription medication abuse.   DVT prophylaxis: SCDs Code Status: Full Family Communication: Discussed with sister and patient's brother-in-law at bedside Disposition Plan: DC home when medically stable, possibly in 3 days.   Consultants:  Orthopedic/Dr. Sharol Given  Procedures:  None  Antimicrobials:  IV Zosyn 1/10 >  IV vancomycin 1/10 >    Subjective: Right second toe pain-worsened swelling. Denies any other complaints.  Objective: Filed Vitals:   06/10/15 2135 06/11/15 0500 06/11/15 0549 06/11/15 1224  BP: 114/77  144/86 149/76  Pulse: 86  92 78  Temp: 98.6 F (37 C)  98.1 F (36.7 C) 98 F (36.7 C)  TempSrc: Oral  Oral   Resp: _0 Weight:  62.7 kg (138 lb 3.7 oz)    SpO2: 97%  97% 98%    Intake/Output Summary (Last 24 hours) at 06/11/15 1432 Last data filed at 06/11/15 0844  Gross per 24 hour  Intake   1340 ml  Output      0 ml  Net   1340 ml   Filed Weights   06/11/15 0500  Weight: 62.7 kg (138 lb 3.7 oz)    Exam:  General exam: Pleasant middle-aged female sitting up in bed in no painful distress. Respiratory system: Clear. No increased work of breathing. Cardiovascular system: S1 & S2 heard, RRR. No JVD, murmurs, gallops, clicks or pedal edema. Gastrointestinal system: Abdomen is nondistended, soft and nontender. Normal bowel sounds heard. Central nervous system: Alert and oriented. No focal neurological deficits. Extremities: Symmetric 5 x 5 power. Right second toe swollen, black and blue discoloration, tender to touch and painful range of motion movements. Bounding dorsalis pedis. Please see pictures below. Toe looks more swollen compared to 1/11        Data Reviewed: Basic Metabolic Panel:  Recent Labs Lab 06/09/15 1915 06/09/15 2115 06/10/15 0515 06/11/15 0543  NA 127* 130* 135 139  K 3.5 3.3* 3.1* 3.9  CL 84* 87* 102 107  CO2 _1 GLUCOSE 662* 527* 302* 271*  BUN <5* <5* <  5* 5*  CREATININE 0.72 0.55 0.36* 0.42*  CALCIUM 10.1 9.8 7.9* 8.5*  MG  --   --   --  2.1   Liver Function Tests:  Recent Labs Lab 06/09/15 1915 06/10/15 0515  AST 15 17  ALT 15 12*  ALKPHOS 167* 117  BILITOT 0.5 0.4  PROT 8.2* 5.6*  ALBUMIN 3.6 2.3*   No results for input(s): LIPASE, AMYLASE in the last 168 hours. No results for input(s): AMMONIA in the last 168  hours. CBC:  Recent Labs Lab 06/09/15 1915 06/10/15 0515  WBC 27.5* 21.2*  NEUTROABS 22.2* 15.9*  HGB 13.9 10.4*  HCT 40.8 31.5*  MCV 89.5 89.0  PLT 339 298   Cardiac Enzymes: No results for input(s): CKTOTAL, CKMB, CKMBINDEX, TROPONINI in the last 168 hours. BNP (last 3 results) No results for input(s): PROBNP in the last 8760 hours. CBG:  Recent Labs Lab 06/10/15 1144 06/10/15 1621 06/10/15 2134 06/11/15 0647 06/11/15 1157  GLUCAP 341* 167* 347* 194* 399*    Recent Results (from the past 240 hour(s))  Culture, blood (Routine X 2) w Reflex to ID Panel     Status: None (Preliminary result)   Collection Time: 06/09/15  9:00 PM  Result Value Ref Range Status   Specimen Description BLOOD LEFT FOREARM  Final   Special Requests BOTTLES DRAWN AEROBIC ONLY 10CC  Final   Culture NO GROWTH 1 DAY  Final   Report Status PENDING  Incomplete  Culture, blood (Routine X 2) w Reflex to ID Panel     Status: None (Preliminary result)   Collection Time: 06/09/15  9:14 PM  Result Value Ref Range Status   Specimen Description BLOOD LEFT ANTECUBITAL  Final   Special Requests BOTTLES DRAWN AEROBIC AND ANAEROBIC 5CC  Final   Culture NO GROWTH 1 DAY  Final   Report Status PENDING  Incomplete         Studies: Dg Hand 2 View Left  06/10/2015  CLINICAL DATA:  Follow-up left index finger cellulitis, history of diabetes EXAM: LEFT HAND - 2 VIEW COMPARISON:  None. FINDINGS: Two views of the left hand submitted. No acute fracture or subluxation. No periosteal reaction or bony erosion. Mild soft tissue swelling noted distal aspect of the second finger. IMPRESSION: No acute fracture or subluxation. Mild soft tissue swelling second finger. No evidence of osteomyelitis. Electronically Signed   By: Lahoma Crocker M.D.   On: 06/10/2015 09:16   Mr Foot Right Wo Contrast  06/10/2015  CLINICAL DATA:  Pain and cellulitis of the second toe of the right foot. EXAM: MRI OF THE RIGHT FOREFOOT WITHOUT CONTRAST  TECHNIQUE: Multiplanar, multisequence MR imaging was performed. No intravenous contrast was administered. COMPARISON:  Radiographs dated 06/09/2015 FINDINGS: There is destruction of the tuft of the distal phalanx of the second toe with abnormal signal throughout the distal phalangeal bone of the second toe with a soft tissue edema and swelling of the toe. Bandages in place. No joint effusions.  The other bones of the forefoot are normal. IMPRESSION: Osteomyelitis of the distal phalanx of the second toe with adjacent cellulitis. Electronically Signed   By: Lorriane Shire M.D.   On: 06/10/2015 09:32   Dg Foot Complete Right  06/09/2015  CLINICAL DATA:  Injury to right foot 2 days ago with swelling, erythema and pain. Initial encounter. EXAM: RIGHT FOOT COMPLETE - 3+ VIEW COMPARISON:  None. FINDINGS: There is no evidence of fracture or dislocation. There is no evidence of arthropathy or  other focal bone abnormality. Soft tissues are unremarkable. IMPRESSION: Negative. Electronically Signed   By: Aletta Edouard M.D.   On: 06/09/2015 19:49        Scheduled Meds: . insulin aspart  0-15 Units Subcutaneous TID WC  . insulin aspart  0-5 Units Subcutaneous QHS  . insulin glargine  15 Units Subcutaneous QHS  . insulin starter kit- syringes  1 kit Other Once  . [START ON 06/12/2015] methadone  120 mg Oral Q24H  . nicotine  21 mg Transdermal Daily  . piperacillin-tazobactam (ZOSYN)  IV  3.375 g Intravenous 3 times per day  . vancomycin  750 mg Intravenous Q12H   Continuous Infusions:    Principal Problem:   Sepsis ([place]) Active Problems:   Diabetic foot infection ([facility])   Hyperosmolar non-ketotic state in patient with type 2 diabetes mellitus ([place])    Time spent: 40 minutes    Ludella Pranger, MD, FACP, FHM. [facility] Pager [phone]  If 7PM-7AM, please contact night-coverage [email or web address] Password [email or web address] 06/11/2015, 2:32 PM    LOS: 2 days

## 2015-06-11 NOTE — Progress Notes (Signed)
Inpatient Diabetes Program Recommendations  AACE/ADA: New Consensus Statement on Inpatient Glycemic Control (2015)  Target Ranges:  Prepandial:   less than 140 mg/dL      Peak postprandial:   less than 180 mg/dL (1-2 hours)      Critically ill patients:  140 - 180 mg/dL   Results for Linda Poole, Linda Poole (MRN [id number]) as of 06/11/2015 09:34  Ref. Range 06/10/2015 06:41 06/10/2015 11:44 06/10/2015 16:21 06/10/2015 21:34 06/11/2015 06:47  Glucose-Capillary Latest Ref Range: 65-99 mg/dL 562244 (H) 130341 (H) 865167 (H) 347 (H) 194 (H)   Review of Glycemic Control  Patient continues to have elevated postprandial glucose levels in the 300's, recommend initiation of Novolog Meal Coverage- Novolog 4 units tidwc  Thanks,  Linda DeemShannon Courtnei Ruddell RN, MSN, [facility] Inpatient Diabetes Coordinator Team Pager [phone] (8a-5p)

## 2015-06-11 NOTE — Plan of Care (Signed)
Problem: Food- and Nutrition-Related Knowledge Deficit (NB-1.1) Goal: Nutrition education Formal process to instruct or train a patient/client in a skill or to impart knowledge to help patients/clients voluntarily manage or modify food choices and eating behavior to maintain or improve health. Outcome: Completed/Met Date Met:  06/11/15  RD consulted for nutrition education regarding diabetes.     Lab Results  Component Value Date    HGBA1C 15.8* 06/09/2015    RD provided "Carbohydrate Counting for People with Diabetes" handout from the Academy of [employer] and Dietetics. Discussed different food groups and their effects on blood sugar, emphasizing carbohydrate-containing foods. Provided list of carbohydrates and recommended serving sizes of common foods.  Discussed importance of controlled and consistent carbohydrate intake throughout the day. Provided examples of ways to balance meals/snacks and encouraged intake of high-fiber, whole grain complex carbohydrates. Pt reports consuming sweet tea and juice on a daily basis. Discussed diabetic friendly drink options. Teach back method used.  Expect good compliance.  Body mass index is 25.28 kg/(m^2). Pt meets criteria for overweight based on current BMI.  Current diet order is carbohydrate modified, patient is consuming approximately 100% of meals at this time. Labs and medications reviewed. No further nutrition interventions warranted at this time. RD contact information provided. If additional nutrition issues arise, please re-consult RD.  Corrin Parker, MS, RD, LDN Pager # [phone] After hours/ weekend pager # [phone]

## 2015-06-11 NOTE — Consult Note (Signed)
Reason for Consult: Abscess ulceration osteomyelitis right foot second toe Referring Physician: Dr. Karis Juba Dietz is an 50 y.o. female.  HPI: Patient is a 50 year old woman with uncontrolled type 2 diabetes not on insulin who presented with abscess cellulitis osteomyelitis right foot second toe.  Past Medical History  Diagnosis Date  . Diabetes mellitus without complication [facility])     Past Surgical History  Procedure Laterality Date  . Tubal ligation      Family History  Problem Relation Age of Onset  . Hypertension Mother   . Stroke Mother   . Diabetes Mellitus II Father     Social History:  reports that she has been smoking.  She does not have any smokeless tobacco history on file. She reports that she does not drink alcohol or use illicit drugs.  Allergies: No Known Allergies  Medications: I have reviewed the patient's current medications.  Results for orders placed or performed during the hospital encounter of 06/09/15 (from the past 48 hour(s))  POC CBG, ED     Status: Abnormal   Collection Time: 06/09/15  6:58 PM  Result Value Ref Range   Glucose-Capillary >600 (HH) 65 - 99 mg/dL  Urinalysis, Routine w reflex microscopic (not at [facility])     Status: Abnormal   Collection Time: 06/09/15  7:11 PM  Result Value Ref Range   Color, Urine YELLOW YELLOW   APPearance CLEAR CLEAR   Specific Gravity, Urine 1.038 (H) 1.005 - 1.030   pH 5.5 5.0 - 8.0   Glucose, UA >1000 (A) NEGATIVE mg/dL   Hgb urine dipstick NEGATIVE NEGATIVE   Bilirubin Urine NEGATIVE NEGATIVE   Ketones, ur NEGATIVE NEGATIVE mg/dL   Protein, ur NEGATIVE NEGATIVE mg/dL   Nitrite NEGATIVE NEGATIVE   Leukocytes, UA NEGATIVE NEGATIVE  Urine microscopic-add on     Status: Abnormal   Collection Time: 06/09/15  7:11 PM  Result Value Ref Range   Squamous Epithelial / LPF 0-5 (A) NONE SEEN   WBC, UA 0-5 0 - 5 WBC/hpf   RBC / HPF 0-5 0 - 5 RBC/hpf   Bacteria, UA RARE (A) NONE SEEN   Urine-Other YEAST  PRESENT   CBC with Differential     Status: Abnormal   Collection Time: 06/09/15  7:15 PM  Result Value Ref Range   WBC 27.5 (H) 4.0 - 10.5 K/uL   RBC 4.56 3.87 - 5.11 MIL/uL   Hemoglobin 13.9 12.0 - 15.0 g/dL   HCT 40.8 36.0 - 46.0 %   MCV 89.5 78.0 - 100.0 fL   MCH 30.5 26.0 - 34.0 pg   MCHC 34.1 30.0 - 36.0 g/dL   RDW 13.4 11.5 - 15.5 %   Platelets 339 150 - 400 K/uL   Neutrophils Relative % 81 %   Lymphocytes Relative 10 %   Monocytes Relative 9 %   Eosinophils Relative 0 %   Basophils Relative 0 %   Neutro Abs 22.2 (H) 1.7 - 7.7 K/uL   Lymphs Abs 2.8 0.7 - 4.0 K/uL   Monocytes Absolute 2.5 (H) 0.1 - 1.0 K/uL   Eosinophils Absolute 0.0 0.0 - 0.7 K/uL   Basophils Absolute 0.0 0.0 - 0.1 K/uL   Smear Review MORPHOLOGY UNREMARKABLE   Comprehensive metabolic panel     Status: Abnormal   Collection Time: 06/09/15  7:15 PM  Result Value Ref Range   Sodium 127 (L) 135 - 145 mmol/L   Potassium 3.5 3.5 - 5.1 mmol/L   Chloride 84 (L)  101 - 111 mmol/L   CO2 27 22 - 32 mmol/L   Glucose, Bld 662 (HH) 65 - 99 mg/dL    Comment: CRITICAL RESULT CALLED TO, READ BACK BY AND VERIFIED WITH: J.GLOSTER,RN 2021 06/09/15 M.CAMPBELL    BUN <5 (L) 6 - 20 mg/dL   Creatinine, Ser 0.72 0.44 - 1.00 mg/dL   Calcium 10.1 8.9 - 10.3 mg/dL   Total Protein 8.2 (H) 6.5 - 8.1 g/dL   Albumin 3.6 3.5 - 5.0 g/dL   AST 15 15 - 41 U/L   ALT 15 14 - 54 U/L   Alkaline Phosphatase 167 (H) 38 - 126 U/L   Total Bilirubin 0.5 0.3 - 1.2 mg/dL   GFR calc non Af Amer >60 >60 mL/min   GFR calc Af Amer >60 >60 mL/min    Comment: (NOTE) The eGFR has been calculated using the CKD EPI equation. This calculation has not been validated in all clinical situations. eGFR's persistently <60 mL/min signify possible Chronic Kidney Disease.    Anion gap 16 (H) 5 - 15  I-Stat CG4 Lactic Acid, ED     Status: Abnormal   Collection Time: 06/09/15  7:29 PM  Result Value Ref Range   Lactic Acid, Venous 3.83 (HH) 0.5 - 2.0  mmol/L   Comment NOTIFIED PHYSICIAN   CBG monitoring, ED     Status: Abnormal   Collection Time: 06/09/15  8:34 PM  Result Value Ref Range   Glucose-Capillary 572 (HH) 65 - 99 mg/dL   Comment 1 Document in Chart   Sedimentation rate     Status: Abnormal   Collection Time: 06/09/15  9:09 PM  Result Value Ref Range   Sed Rate 83 (H) 0 - 22 mm/hr  Uric acid     Status: None   Collection Time: 06/09/15  9:09 PM  Result Value Ref Range   Uric Acid, Serum 2.7 2.3 - 6.6 mg/dL  Procalcitonin - Baseline     Status: None   Collection Time: 06/09/15  9:09 PM  Result Value Ref Range   Procalcitonin 0.55 ng/mL    Comment:        Interpretation: PCT > 0.5 ng/mL and <= 2 ng/mL: Systemic infection (sepsis) is possible, but other conditions are known to elevate PCT as well. (NOTE)         ICU PCT Algorithm               Non ICU PCT Algorithm    ----------------------------     ------------------------------         PCT < 0.25 ng/mL                 PCT < 0.1 ng/mL     Stopping of antibiotics            Stopping of antibiotics       strongly encouraged.               strongly encouraged.    ----------------------------     ------------------------------       PCT level decrease by               PCT < 0.25 ng/mL       >= 80% from peak PCT       OR PCT 0.25 - 0.5 ng/mL          Stopping of antibiotics  encouraged.     Stopping of antibiotics           encouraged.    ----------------------------     ------------------------------       PCT level decrease by              PCT >= 0.25 ng/mL       < 80% from peak PCT        AND PCT >= 0.5 ng/mL             Continuing antibiotics                                              encouraged.       Continuing antibiotics            encouraged.    ----------------------------     ------------------------------     PCT level increase compared          PCT > 0.5 ng/mL         with peak PCT AND          PCT >=  0.5 ng/mL             Escalation of antibiotics                                          strongly encouraged.      Escalation of antibiotics        strongly encouraged.   Basic metabolic panel     Status: Abnormal   Collection Time: 06/09/15  9:15 PM  Result Value Ref Range   Sodium 130 (L) 135 - 145 mmol/L   Potassium 3.3 (L) 3.5 - 5.1 mmol/L   Chloride 87 (L) 101 - 111 mmol/L   CO2 27 22 - 32 mmol/L   Glucose, Bld 527 (H) 65 - 99 mg/dL   BUN <5 (L) 6 - 20 mg/dL   Creatinine, Ser 0.55 0.44 - 1.00 mg/dL   Calcium 9.8 8.9 - 10.3 mg/dL   GFR calc non Af Amer >60 >60 mL/min   GFR calc Af Amer >60 >60 mL/min    Comment: (NOTE) The eGFR has been calculated using the CKD EPI equation. This calculation has not been validated in all clinical situations. eGFR's persistently <60 mL/min signify possible Chronic Kidney Disease.    Anion gap 16 (H) 5 - 15  Hemoglobin A1c     Status: Abnormal   Collection Time: 06/09/15  9:26 PM  Result Value Ref Range   Hgb A1c MFr Bld 15.8 (H) 4.8 - 5.6 %    Comment: (NOTE)         Pre-diabetes: 5.7 - 6.4         Diabetes: >6.4         Glycemic control for adults with diabetes: <7.0    Mean Plasma Glucose 407 mg/dL    Comment: (NOTE) Performed At: [facility] [address] [place], [place] [id number] Lindon Romp MD [id number]   CBG monitoring, ED     Status: Abnormal   Collection Time: 06/09/15  9:28 PM  Result Value Ref Range   Glucose-Capillary 470 (H) 65 - 99 mg/dL  I-Stat CG4 Lactic Acid, ED     Status: Abnormal   Collection  Time: 06/09/15  9:49 PM  Result Value Ref Range   Lactic Acid, Venous 3.36 (HH) 0.5 - 2.0 mmol/L   Comment NOTIFIED PHYSICIAN   CBG monitoring, ED     Status: Abnormal   Collection Time: 06/09/15 10:44 PM  Result Value Ref Range   Glucose-Capillary 302 (H) 65 - 99 mg/dL  CBG monitoring, ED     Status: Abnormal   Collection Time: 06/09/15 11:59 PM  Result Value Ref Range   Glucose-Capillary 200 (H)  65 - 99 mg/dL  Pregnancy, urine     Status: None   Collection Time: 06/10/15 12:29 AM  Result Value Ref Range   Preg Test, Ur NEGATIVE NEGATIVE    Comment:        THE SENSITIVITY OF THIS METHODOLOGY IS >20 mIU/mL.   Lactic acid, plasma     Status: Abnormal   Collection Time: 06/10/15  3:21 AM  Result Value Ref Range   Lactic Acid, Venous 2.3 (HH) 0.5 - 2.0 mmol/L    Comment: CRITICAL RESULT CALLED TO, READ BACK BY AND VERIFIED WITH: R.RONISENDIMAN,RN 0422 06/10/15 M.CAMPBELL   Comprehensive metabolic panel     Status: Abnormal   Collection Time: 06/10/15  5:15 AM  Result Value Ref Range   Sodium 135 135 - 145 mmol/L   Potassium 3.1 (L) 3.5 - 5.1 mmol/L   Chloride 102 101 - 111 mmol/L   CO2 26 22 - 32 mmol/L   Glucose, Bld 302 (H) 65 - 99 mg/dL   BUN <5 (L) 6 - 20 mg/dL   Creatinine, Ser 0.36 (L) 0.44 - 1.00 mg/dL   Calcium 7.9 (L) 8.9 - 10.3 mg/dL   Total Protein 5.6 (L) 6.5 - 8.1 g/dL   Albumin 2.3 (L) 3.5 - 5.0 g/dL   AST 17 15 - 41 U/L   ALT 12 (L) 14 - 54 U/L   Alkaline Phosphatase 117 38 - 126 U/L   Total Bilirubin 0.4 0.3 - 1.2 mg/dL   GFR calc non Af Amer >60 >60 mL/min   GFR calc Af Amer >60 >60 mL/min    Comment: (NOTE) The eGFR has been calculated using the CKD EPI equation. This calculation has not been validated in all clinical situations. eGFR's persistently <60 mL/min signify possible Chronic Kidney Disease.    Anion gap 7 5 - 15  Lactic acid, plasma     Status: None   Collection Time: 06/10/15  5:15 AM  Result Value Ref Range   Lactic Acid, Venous 1.6 0.5 - 2.0 mmol/L  CBC with Differential     Status: Abnormal   Collection Time: 06/10/15  5:15 AM  Result Value Ref Range   WBC 21.2 (H) 4.0 - 10.5 K/uL    Comment: REPEATED TO VERIFY   RBC 3.54 (L) 3.87 - 5.11 MIL/uL   Hemoglobin 10.4 (L) 12.0 - 15.0 g/dL    Comment: REPEATED TO VERIFY   HCT 31.5 (L) 36.0 - 46.0 %   MCV 89.0 78.0 - 100.0 fL   MCH 29.4 26.0 - 34.0 pg   MCHC 33.0 30.0 - 36.0 g/dL    RDW 13.6 11.5 - 15.5 %   Platelets 298 150 - 400 K/uL    Comment: REPEATED TO VERIFY   Neutrophils Relative % 75 %   Neutro Abs 15.9 (H) 1.7 - 7.7 K/uL   Lymphocytes Relative 17 %   Lymphs Abs 3.6 0.7 - 4.0 K/uL   Monocytes Relative 8 %   Monocytes Absolute 1.6 (H) 0.1 -  1.0 K/uL   Eosinophils Relative 1 %   Eosinophils Absolute 0.1 0.0 - 0.7 K/uL   Basophils Relative 0 %   Basophils Absolute 0.0 0.0 - 0.1 K/uL  Glucose, capillary     Status: Abnormal   Collection Time: 06/10/15  6:41 AM  Result Value Ref Range   Glucose-Capillary 244 (H) 65 - 99 mg/dL  Glucose, capillary     Status: Abnormal   Collection Time: 06/10/15 11:44 AM  Result Value Ref Range   Glucose-Capillary 341 (H) 65 - 99 mg/dL  Glucose, capillary     Status: Abnormal   Collection Time: 06/10/15  4:21 PM  Result Value Ref Range   Glucose-Capillary 167 (H) 65 - 99 mg/dL   Comment 1 Notify RN   Glucose, capillary     Status: Abnormal   Collection Time: 06/10/15  9:34 PM  Result Value Ref Range   Glucose-Capillary 347 (H) 65 - 99 mg/dL   Comment 1 Notify RN    Comment 2 Document in Chart     Dg Hand 2 View Left  06/10/2015  CLINICAL DATA:  Follow-up left index finger cellulitis, history of diabetes EXAM: LEFT HAND - 2 VIEW COMPARISON:  None. FINDINGS: Two views of the left hand submitted. No acute fracture or subluxation. No periosteal reaction or bony erosion. Mild soft tissue swelling noted distal aspect of the second finger. IMPRESSION: No acute fracture or subluxation. Mild soft tissue swelling second finger. No evidence of osteomyelitis. Electronically Signed   By: Lahoma Crocker M.D.   On: 06/10/2015 09:16   Mr Foot Right Wo Contrast  06/10/2015  CLINICAL DATA:  Pain and cellulitis of the second toe of the right foot. EXAM: MRI OF THE RIGHT FOREFOOT WITHOUT CONTRAST TECHNIQUE: Multiplanar, multisequence MR imaging was performed. No intravenous contrast was administered. COMPARISON:  Radiographs dated 06/09/2015  FINDINGS: There is destruction of the tuft of the distal phalanx of the second toe with abnormal signal throughout the distal phalangeal bone of the second toe with a soft tissue edema and swelling of the toe. Bandages in place. No joint effusions.  The other bones of the forefoot are normal. IMPRESSION: Osteomyelitis of the distal phalanx of the second toe with adjacent cellulitis. Electronically Signed   By: Lorriane Shire M.D.   On: 06/10/2015 09:32   Dg Foot Complete Right  06/09/2015  CLINICAL DATA:  Injury to right foot 2 days ago with swelling, erythema and pain. Initial encounter. EXAM: RIGHT FOOT COMPLETE - 3+ VIEW COMPARISON:  None. FINDINGS: There is no evidence of fracture or dislocation. There is no evidence of arthropathy or other focal bone abnormality. Soft tissues are unremarkable. IMPRESSION: Negative. Electronically Signed   By: Aletta Edouard M.D.   On: 06/09/2015 19:49    Review of Systems  All other systems reviewed and are negative.  Blood pressure 144/86, pulse 92, temperature 98.1 F (36.7 C), temperature source Oral, resp. rate 18, weight 62.7 kg (138 lb 3.7 oz), SpO2 97 %. Physical Exam On examination patient has a good dorsalis pedis pulse she has no venous stasis swelling there is cellulitis in her foot. Left foot has blistering ulceration cellulitis and radiographs shows osteomyelitis of the toe. Assessment/Plan: Assessment: Osteomyelitis abscess ulceration right foot second toe with uncontrolled diabetes with a good dorsalis pedis pulse.  Plan: We'll plan for a right foot second Ray amputation tomorrow on Friday afternoon. Continue IV antibiotics for at least 48 hours postoperatively patient may be discharged to home at that  time I will follow-up in the office in 1 week after surgery.  Iriana Artley V 06/11/2015, 6:52 AM

## 2015-06-12 ENCOUNTER — Inpatient Hospital Stay (HOSPITAL_COMMUNITY): Payer: MEDICAID | Admitting: Certified Registered Nurse Anesthetist

## 2015-06-12 ENCOUNTER — Inpatient Hospital Stay (HOSPITAL_COMMUNITY): Payer: Self-pay | Admitting: Certified Registered Nurse Anesthetist

## 2015-06-12 ENCOUNTER — Encounter (HOSPITAL_COMMUNITY): Payer: Self-pay | Admitting: Certified Registered Nurse Anesthetist

## 2015-06-12 ENCOUNTER — Encounter (HOSPITAL_COMMUNITY)
Admission: EM | Disposition: A | Discharge status: Home / Self Care | Payer: Self-pay | Source: Home / Self Care | Attending: Internal Medicine

## 2015-06-12 HISTORY — PX: AMPUTATION: SHX166

## 2015-06-12 LAB — CBC
HEMATOCRIT: 34.2 % — AB (ref 36.0–46.0)
HEMOGLOBIN: 11.2 g/dL — AB (ref 12.0–15.0)
MCH: 29.8 pg (ref 26.0–34.0)
MCHC: 32.7 g/dL (ref 30.0–36.0)
MCV: 91 fL (ref 78.0–100.0)
Platelets: 308 10*3/uL (ref 150–400)
RBC: 3.76 MIL/uL — AB (ref 3.87–5.11)
RDW: 14 % (ref 11.5–15.5)
WBC: 16.2 10*3/uL — AB (ref 4.0–10.5)

## 2015-06-12 LAB — GLUCOSE, CAPILLARY
GLUCOSE-CAPILLARY: 238 mg/dL — AB (ref 65–99)
GLUCOSE-CAPILLARY: 199 mg/dL — AB (ref 65–99)
GLUCOSE-CAPILLARY: 213 mg/dL — AB (ref 65–99)
GLUCOSE-CAPILLARY: 149 mg/dL — AB (ref 65–99)
Glucose-Capillary: 218 mg/dL — ABNORMAL HIGH (ref 65–99)
Glucose-Capillary: 221 mg/dL — ABNORMAL HIGH (ref 65–99)

## 2015-06-12 LAB — SURGICAL PCR SCREEN
MRSA, PCR: NEGATIVE
STAPHYLOCOCCUS AUREUS: NEGATIVE

## 2015-06-12 SURGERY — AMPUTATION, FOOT, RAY
Anesthesia: Regional | Site: Foot | Laterality: Right

## 2015-06-12 MED ORDER — 0.9 % SODIUM CHLORIDE (POUR BTL) OPTIME
TOPICAL | Status: DC | PRN
  Administered 2015-06-12: 1000 mL

## 2015-06-12 MED ORDER — PROPOFOL 10 MG/ML IV BOLUS
INTRAVENOUS | Status: DC | PRN
  Administered 2015-06-12 (×2): 30 mg via INTRAVENOUS

## 2015-06-12 MED ORDER — PROPOFOL 10 MG/ML IV BOLUS
INTRAVENOUS | Status: AC
  Filled 2015-06-12: qty 40

## 2015-06-12 MED ORDER — INSULIN ASPART 100 UNIT/ML ~~LOC~~ SOLN
4.0000 [IU] | Freq: Once | SUBCUTANEOUS | Status: AC
  Administered 2015-06-12: 4 [IU] via SUBCUTANEOUS
  Filled 2015-06-12: qty 0.04

## 2015-06-12 MED ORDER — METOCLOPRAMIDE HCL 5 MG/ML IJ SOLN: 5.0000 mg | Freq: Three times a day (TID) | INTRAMUSCULAR | Status: DC | PRN

## 2015-06-12 MED ORDER — SODIUM CHLORIDE 0.9 % IV SOLN
INTRAVENOUS | Status: DC
  Administered 2015-06-12: 23:00:00 via INTRAVENOUS

## 2015-06-12 MED ORDER — ACETAMINOPHEN 650 MG RE SUPP: 650.0000 mg | Freq: Four times a day (QID) | RECTAL | Status: DC | PRN

## 2015-06-12 MED ORDER — HYDROMORPHONE HCL 1 MG/ML IJ SOLN
INTRAMUSCULAR | Status: AC
  Administered 2015-06-12: 0.5 mg via INTRAVENOUS
  Filled 2015-06-12: qty 1

## 2015-06-12 MED ORDER — METHOCARBAMOL 1000 MG/10ML IJ SOLN: 500.0000 mg | Freq: Four times a day (QID) | INTRAMUSCULAR | Status: DC | PRN

## 2015-06-12 MED ORDER — LACTATED RINGERS IV SOLN: INTRAVENOUS | Status: DC

## 2015-06-12 MED ORDER — ACETAMINOPHEN 325 MG PO TABS
650.0000 mg | ORAL_TABLET | Freq: Four times a day (QID) | ORAL | Status: DC | PRN
  Administered 2015-06-13 – 2015-06-15 (×4): 650 mg via ORAL
  Filled 2015-06-12 (×4): qty 2

## 2015-06-12 MED ORDER — METHOCARBAMOL 500 MG PO TABS
500.0000 mg | ORAL_TABLET | Freq: Four times a day (QID) | ORAL | Status: DC | PRN
  Administered 2015-06-13 – 2015-06-14 (×3): 500 mg via ORAL
  Filled 2015-06-12 (×3): qty 1

## 2015-06-12 MED ORDER — FENTANYL CITRATE (PF) 100 MCG/2ML IJ SOLN
INTRAMUSCULAR | Status: AC
  Administered 2015-06-12: 50 ug via INTRAVENOUS
  Filled 2015-06-12: qty 2

## 2015-06-12 MED ORDER — FENTANYL CITRATE (PF) 100 MCG/2ML IJ SOLN
INTRAMUSCULAR | Status: DC | PRN
  Administered 2015-06-12: 100 ug via INTRAVENOUS

## 2015-06-12 MED ORDER — ONDANSETRON HCL 4 MG PO TABS: 4.0000 mg | ORAL_TABLET | Freq: Four times a day (QID) | ORAL | Status: DC | PRN

## 2015-06-12 MED ORDER — ONDANSETRON HCL 4 MG/2ML IJ SOLN
4.0000 mg | Freq: Four times a day (QID) | INTRAMUSCULAR | Status: DC | PRN
Start: 2015-06-12 — End: 2015-06-14

## 2015-06-12 MED ORDER — BUPIVACAINE-EPINEPHRINE (PF) 0.5% -1:200000 IJ SOLN
INTRAMUSCULAR | Status: DC | PRN
  Administered 2015-06-12: 30 mL via PERINEURAL

## 2015-06-12 MED ORDER — PROPOFOL 500 MG/50ML IV EMUL
INTRAVENOUS | Status: DC | PRN
  Administered 2015-06-12: 80 ug/kg/min via INTRAVENOUS

## 2015-06-12 MED ORDER — ONDANSETRON HCL 4 MG/2ML IJ SOLN
INTRAMUSCULAR | Status: AC
  Filled 2015-06-12: qty 2

## 2015-06-12 MED ORDER — HYDROMORPHONE HCL 1 MG/ML IJ SOLN: 1.0000 mg | INTRAMUSCULAR | Status: DC | PRN

## 2015-06-12 MED ORDER — METOCLOPRAMIDE HCL 5 MG PO TABS: 5.0000 mg | ORAL_TABLET | Freq: Three times a day (TID) | ORAL | Status: DC | PRN

## 2015-06-12 MED ORDER — FENTANYL CITRATE (PF) 250 MCG/5ML IJ SOLN
INTRAMUSCULAR | Status: AC
  Filled 2015-06-12: qty 5

## 2015-06-12 MED ORDER — ROCURONIUM BROMIDE 50 MG/5ML IV SOLN
INTRAVENOUS | Status: AC
  Filled 2015-06-12: qty 1

## 2015-06-12 MED ORDER — SODIUM CHLORIDE 0.9 % IV SOLN
INTRAVENOUS | Status: DC | PRN
  Administered 2015-06-12: 17:00:00 via INTRAVENOUS

## 2015-06-12 MED ORDER — HYDROMORPHONE HCL 1 MG/ML IJ SOLN
0.2500 mg | INTRAMUSCULAR | Status: DC | PRN
Start: 2015-06-12 — End: 2015-06-12
  Administered 2015-06-12 (×2): 0.5 mg via INTRAVENOUS

## 2015-06-12 MED ORDER — OXYCODONE HCL 5 MG PO TABS
5.0000 mg | ORAL_TABLET | ORAL | Status: DC | PRN
  Administered 2015-06-13: 10 mg via ORAL
  Filled 2015-06-12: qty 2

## 2015-06-12 MED ORDER — MIDAZOLAM HCL 2 MG/2ML IJ SOLN
INTRAMUSCULAR | Status: AC
  Administered 2015-06-12: 1 mg
  Filled 2015-06-12: qty 2

## 2015-06-12 SURGICAL SUPPLY — 33 items
BLADE SAW SGTL MED 73X18.5 STR (BLADE) IMPLANT
BNDG COHESIVE 4X5 TAN STRL (GAUZE/BANDAGES/DRESSINGS) ×2 IMPLANT
BNDG GAUZE ELAST 4 BULKY (GAUZE/BANDAGES/DRESSINGS) ×2 IMPLANT
COVER SURGICAL LIGHT HANDLE (MISCELLANEOUS) ×4 IMPLANT
DRAPE U-SHAPE 47X51 STRL (DRAPES) ×4 IMPLANT
DRSG ADAPTIC 3X8 NADH LF (GAUZE/BANDAGES/DRESSINGS) ×2 IMPLANT
DRSG PAD ABDOMINAL 8X10 ST (GAUZE/BANDAGES/DRESSINGS) ×2 IMPLANT
DURAPREP 26ML APPLICATOR (WOUND CARE) IMPLANT
ELECT REM PT RETURN 9FT ADLT (ELECTROSURGICAL) ×2
ELECTRODE REM PT RTRN 9FT ADLT (ELECTROSURGICAL) ×1 IMPLANT
GAUZE SPONGE 4X4 12PLY STRL (GAUZE/BANDAGES/DRESSINGS) IMPLANT
GLOVE BIOGEL PI IND STRL 9 (GLOVE) ×1 IMPLANT
GLOVE BIOGEL PI INDICATOR 9 (GLOVE) ×1
GLOVE SURG ORTHO 9.0 STRL STRW (GLOVE) ×2 IMPLANT
GLOVE SURG SS PI 6.5 STRL IVOR (GLOVE) ×2 IMPLANT
GOWN STRL REUS W/ TWL LRG LVL3 (GOWN DISPOSABLE) ×1 IMPLANT
GOWN STRL REUS W/ TWL XL LVL3 (GOWN DISPOSABLE) ×2 IMPLANT
GOWN STRL REUS W/TWL LRG LVL3 (GOWN DISPOSABLE) ×1
GOWN STRL REUS W/TWL XL LVL3 (GOWN DISPOSABLE) ×2
KIT BASIN OR (CUSTOM PROCEDURE TRAY) ×2 IMPLANT
KIT ROOM TURNOVER OR (KITS) ×2 IMPLANT
NS IRRIG 1000ML POUR BTL (IV SOLUTION) ×2 IMPLANT
PACK ORTHO EXTREMITY (CUSTOM PROCEDURE TRAY) ×2 IMPLANT
PAD ARMBOARD 7.5X6 YLW CONV (MISCELLANEOUS) ×4 IMPLANT
SPONGE GAUZE 4X4 12PLY STER LF (GAUZE/BANDAGES/DRESSINGS) ×2 IMPLANT
SPONGE LAP 18X18 X RAY DECT (DISPOSABLE) ×2 IMPLANT
STOCKINETTE IMPERVIOUS LG (DRAPES) IMPLANT
SUT ETHILON 2 0 PSLX (SUTURE) ×2 IMPLANT
TOWEL OR 17X24 6PK STRL BLUE (TOWEL DISPOSABLE) ×2 IMPLANT
TOWEL OR 17X26 10 PK STRL BLUE (TOWEL DISPOSABLE) ×2 IMPLANT
TUBE ANAEROBIC SPECIMEN COL (MISCELLANEOUS) ×2 IMPLANT
UNDERPAD 30X30 INCONTINENT (UNDERPADS AND DIAPERS) ×2 IMPLANT
WATER STERILE IRR 1000ML POUR (IV SOLUTION) ×2 IMPLANT

## 2015-06-12 NOTE — Transfer of Care (Signed)
Immediate Anesthesia Transfer of Care Note  Patient: Linda RocheJulie Poole  Procedure(s) Performed: Procedure(s): Right Foot 2nd Ray Amputation (Right)  Patient Location: PACU  Anesthesia Type:MAC combined with regional for post-op pain  Level of Consciousness: awake, oriented and patient cooperative  Airway & Oxygen Therapy: Patient Spontanous Breathing and Patient connected to nasal cannula oxygen  Post-op Assessment: Report given to RN, Post -op Vital signs reviewed and stable and Patient moving all extremities  Post vital signs: Reviewed and stable  Last Vitals:  Filed Vitals:   06/12/15 1613 06/12/15 1618  BP: 104/65 120/73  Pulse: 90 91  Temp:    Resp: 12 15    Complications: No apparent anesthesia complications

## 2015-06-12 NOTE — Op Note (Signed)
06/09/2015 - 06/12/2015  5:24 PM  PATIENT:  Linda Poole    PRE-OPERATIVE DIAGNOSIS:  Osteomyelitis Right 2nd Toe  POST-OPERATIVE DIAGNOSIS:  Same  PROCEDURE:  Right Foot 2nd Ray Amputation Local tissue rearrangement for wound closure 3 x 7 cm  SURGEON:  Nadara MustardUDA,Shamica Moree V, MD  PHYSICIAN ASSISTANT:None ANESTHESIA:   General  PREOPERATIVE INDICATIONS:  Linda Poole is a  50 y.o. female with a diagnosis of Osteomyelitis Right 2nd Toe who failed conservative measures and elected for surgical management.    The risks benefits and alternatives were discussed with the patient preoperatively including but not limited to the risks of infection, bleeding, nerve injury, cardiopulmonary complications, the need for revision surgery, among others, and the patient was willing to proceed.  OPERATIVE IMPLANTS: None  OPERATIVE FINDINGS: Abscess cultures obtained 2  OPERATIVE PROCEDURE: Patient brought the operating room and underwent a general anesthetic. After adequate levels anesthesia obtained patient's right lower extremity was prepped using DuraPrep draped into a sterile field. A timeout was called. A elliptical incision was made around the toe extending up to the base of the second metatarsal. A second Ray amputation was performed through the shaft of the second metatarsal. The toe ulcer and soft tissue were resected in 1 block of tissue. Cultures were obtained 2. The deep tissues showed a no deep abscess however there was minimal petechial bleeding. The wound was irrigated with normal saline. Local tissue rearrangement was performed for wound closure 3 x 7 cm. Wound was closed using 2-0 nylon. A sterile compressive dressing was applied. Patient was extubated taken the PACU in stable condition.    Recommend discharge on 2 weeks of oral antibiotics.

## 2015-06-12 NOTE — Progress Notes (Signed)
Pharmacy Antibiotic Follow-up Note  Linda RocheJulie Lawley is a 50 y.o. year-old female admitted on 06/09/2015.  The patient is currently on day 4 of Vanco/Zosyn for osteo.  Assessment/Plan: 49 yof with wound to 2nd digit on R foot, swelling, redness. Pharmacy consulted to dose Vancomycin for diabetic foot infection, high MRSA risk per MD.  Afeb, wbc 27.5, SCr 0.72, CrCl~61 on admit. CTX/flagyl ordered per MD in ED. Recently treated with keflex for similar wound to R finger.  Infectious Disease: Diabetic foot infection involving 2 toe of R foot. MRI sx for  osteomyelitis of the distal phalanx of the second toe with adjacent cellulitis. WBC 27.5>21.2>16.2. Scr 0.42 with CrCl 77  1/10 vancomycin >>  1/11 Zosyn >>   1/10 BCx: px  Plan: -Vancomycin 750 mg Q12H started 1/11 -Zosyn 3.375g IV q8hr. -Right foot second Ray amputation Friday afternoon. Continue IV antibiotics for at least 48 hours postoperatively. Level likely not necessary.   Temp (24hrs), Avg:98.9 F (37.2 C), Min:98.5 F (36.9 C), Max:99.3 F (37.4 C)   Recent Labs Lab 06/09/15 1915 06/10/15 0515 06/12/15 0535  WBC 27.5* 21.2* 16.2*    Recent Labs Lab 06/09/15 1915 06/09/15 2115 06/10/15 0515 06/11/15 0543  CREATININE 0.72 0.55 0.36* 0.42*   Estimated Creatinine Clearance: 77 mL/min (by C-G formula based on Cr of 0.42).    No Known Allergies  [name] [name], PharmD, BCPS Clinical Staff Pharmacist Pager [phone]  [name], [name] PharmD 06/12/2015 3:02 PM

## 2015-06-12 NOTE — Anesthesia Preprocedure Evaluation (Addendum)
Anesthesia Evaluation  Patient identified by MRN, date of birth, ID band Patient awake    Reviewed: Allergy & Precautions, H&P , NPO status , Patient's Chart, lab work & pertinent test results  Airway Mallampati: II  TM Distance: >3 FB Neck ROM: Full    Dental no notable dental hx. (+) Poor Dentition, Dental Advisory Given   Pulmonary Current Smoker,    Pulmonary exam normal breath sounds clear to auscultation       Cardiovascular negative cardio ROS   Rhythm:Regular Rate:Normal     Neuro/Psych negative neurological ROS  negative psych ROS   GI/Hepatic negative GI ROS, Neg liver ROS,   Endo/Other  diabetes, Insulin Dependent, Oral Hypoglycemic Agents  Renal/GU negative Renal ROS  negative genitourinary   Musculoskeletal   Abdominal   Peds  Hematology negative hematology ROS (+)   Anesthesia Other Findings   Reproductive/Obstetrics negative OB ROS                           Anesthesia Physical Anesthesia Plan  ASA: III  Anesthesia Plan: Regional and MAC   Post-op Pain Management:    Induction: Intravenous  Airway Management Planned: Simple Face Mask  Additional Equipment:   Intra-op Plan:   Post-operative Plan:   Informed Consent: I have reviewed the patients History and Physical, chart, labs and discussed the procedure including the risks, benefits and alternatives for the proposed anesthesia with the patient or authorized representative who has indicated his/her understanding and acceptance.   Dental advisory given  Plan Discussed with: CRNA  Anesthesia Plan Comments:       Anesthesia Quick Evaluation

## 2015-06-12 NOTE — H&P (View-Only) (Signed)
Reason for Consult: Abscess ulceration osteomyelitis right foot second toe Referring Physician: Dr. Karis Juba Linda Poole is an 50 y.o. female.  HPI: Patient is a 50 year old woman with uncontrolled type 2 diabetes not on insulin who presented with abscess cellulitis osteomyelitis right foot second toe.  Past Medical History  Diagnosis Date  . Diabetes mellitus without complication [facility])     Past Surgical History  Procedure Laterality Date  . Tubal ligation      Family History  Problem Relation Age of Onset  . Hypertension Mother   . Stroke Mother   . Diabetes Mellitus II Father     Social History:  reports that she has been smoking.  She does not have any smokeless tobacco history on file. She reports that she does not drink alcohol or use illicit drugs.  Allergies: No Known Allergies  Medications: I have reviewed the patient's current medications.  Results for orders placed or performed during the hospital encounter of 06/09/15 (from the past 48 hour(s))  POC CBG, ED     Status: Abnormal   Collection Time: 06/09/15  6:58 PM  Result Value Ref Range   Glucose-Capillary >600 (HH) 65 - 99 mg/dL  Urinalysis, Routine w reflex microscopic (not at [facility])     Status: Abnormal   Collection Time: 06/09/15  7:11 PM  Result Value Ref Range   Color, Urine YELLOW YELLOW   APPearance CLEAR CLEAR   Specific Gravity, Urine 1.038 (H) 1.005 - 1.030   pH 5.5 5.0 - 8.0   Glucose, UA >1000 (A) NEGATIVE mg/dL   Hgb urine dipstick NEGATIVE NEGATIVE   Bilirubin Urine NEGATIVE NEGATIVE   Ketones, ur NEGATIVE NEGATIVE mg/dL   Protein, ur NEGATIVE NEGATIVE mg/dL   Nitrite NEGATIVE NEGATIVE   Leukocytes, UA NEGATIVE NEGATIVE  Urine microscopic-add on     Status: Abnormal   Collection Time: 06/09/15  7:11 PM  Result Value Ref Range   Squamous Epithelial / LPF 0-5 (A) NONE SEEN   WBC, UA 0-5 0 - 5 WBC/hpf   RBC / HPF 0-5 0 - 5 RBC/hpf   Bacteria, UA RARE (A) NONE SEEN   Urine-Other YEAST  PRESENT   CBC with Differential     Status: Abnormal   Collection Time: 06/09/15  7:15 PM  Result Value Ref Range   WBC 27.5 (H) 4.0 - 10.5 K/uL   RBC 4.56 3.87 - 5.11 MIL/uL   Hemoglobin 13.9 12.0 - 15.0 g/dL   HCT 40.8 36.0 - 46.0 %   MCV 89.5 78.0 - 100.0 fL   MCH 30.5 26.0 - 34.0 pg   MCHC 34.1 30.0 - 36.0 g/dL   RDW 13.4 11.5 - 15.5 %   Platelets 339 150 - 400 K/uL   Neutrophils Relative % 81 %   Lymphocytes Relative 10 %   Monocytes Relative 9 %   Eosinophils Relative 0 %   Basophils Relative 0 %   Neutro Abs 22.2 (H) 1.7 - 7.7 K/uL   Lymphs Abs 2.8 0.7 - 4.0 K/uL   Monocytes Absolute 2.5 (H) 0.1 - 1.0 K/uL   Eosinophils Absolute 0.0 0.0 - 0.7 K/uL   Basophils Absolute 0.0 0.0 - 0.1 K/uL   Smear Review MORPHOLOGY UNREMARKABLE   Comprehensive metabolic panel     Status: Abnormal   Collection Time: 06/09/15  7:15 PM  Result Value Ref Range   Sodium 127 (L) 135 - 145 mmol/L   Potassium 3.5 3.5 - 5.1 mmol/L   Chloride 84 (L)  101 - 111 mmol/L   CO2 27 22 - 32 mmol/L   Glucose, Bld 662 (HH) 65 - 99 mg/dL    Comment: CRITICAL RESULT CALLED TO, READ BACK BY AND VERIFIED WITH: J.GLOSTER,RN 2021 06/09/15 M.CAMPBELL    BUN <5 (L) 6 - 20 mg/dL   Creatinine, Ser 0.72 0.44 - 1.00 mg/dL   Calcium 10.1 8.9 - 10.3 mg/dL   Total Protein 8.2 (H) 6.5 - 8.1 g/dL   Albumin 3.6 3.5 - 5.0 g/dL   AST 15 15 - 41 U/L   ALT 15 14 - 54 U/L   Alkaline Phosphatase 167 (H) 38 - 126 U/L   Total Bilirubin 0.5 0.3 - 1.2 mg/dL   GFR calc non Af Amer >60 >60 mL/min   GFR calc Af Amer >60 >60 mL/min    Comment: (NOTE) The eGFR has been calculated using the CKD EPI equation. This calculation has not been validated in all clinical situations. eGFR's persistently <60 mL/min signify possible Chronic Kidney Disease.    Anion gap 16 (H) 5 - 15  I-Stat CG4 Lactic Acid, ED     Status: Abnormal   Collection Time: 06/09/15  7:29 PM  Result Value Ref Range   Lactic Acid, Venous 3.83 (HH) 0.5 - 2.0  mmol/L   Comment NOTIFIED PHYSICIAN   CBG monitoring, ED     Status: Abnormal   Collection Time: 06/09/15  8:34 PM  Result Value Ref Range   Glucose-Capillary 572 (HH) 65 - 99 mg/dL   Comment 1 Document in Chart   Sedimentation rate     Status: Abnormal   Collection Time: 06/09/15  9:09 PM  Result Value Ref Range   Sed Rate 83 (H) 0 - 22 mm/hr  Uric acid     Status: None   Collection Time: 06/09/15  9:09 PM  Result Value Ref Range   Uric Acid, Serum 2.7 2.3 - 6.6 mg/dL  Procalcitonin - Baseline     Status: None   Collection Time: 06/09/15  9:09 PM  Result Value Ref Range   Procalcitonin 0.55 ng/mL    Comment:        Interpretation: PCT > 0.5 ng/mL and <= 2 ng/mL: Systemic infection (sepsis) is possible, but other conditions are known to elevate PCT as well. (NOTE)         ICU PCT Algorithm               Non ICU PCT Algorithm    ----------------------------     ------------------------------         PCT < 0.25 ng/mL                 PCT < 0.1 ng/mL     Stopping of antibiotics            Stopping of antibiotics       strongly encouraged.               strongly encouraged.    ----------------------------     ------------------------------       PCT level decrease by               PCT < 0.25 ng/mL       >= 80% from peak PCT       OR PCT 0.25 - 0.5 ng/mL          Stopping of antibiotics  encouraged.     Stopping of antibiotics           encouraged.    ----------------------------     ------------------------------       PCT level decrease by              PCT >= 0.25 ng/mL       < 80% from peak PCT        AND PCT >= 0.5 ng/mL             Continuing antibiotics                                              encouraged.       Continuing antibiotics            encouraged.    ----------------------------     ------------------------------     PCT level increase compared          PCT > 0.5 ng/mL         with peak PCT AND          PCT >=  0.5 ng/mL             Escalation of antibiotics                                          strongly encouraged.      Escalation of antibiotics        strongly encouraged.   Basic metabolic panel     Status: Abnormal   Collection Time: 06/09/15  9:15 PM  Result Value Ref Range   Sodium 130 (L) 135 - 145 mmol/L   Potassium 3.3 (L) 3.5 - 5.1 mmol/L   Chloride 87 (L) 101 - 111 mmol/L   CO2 27 22 - 32 mmol/L   Glucose, Bld 527 (H) 65 - 99 mg/dL   BUN <5 (L) 6 - 20 mg/dL   Creatinine, Ser 0.55 0.44 - 1.00 mg/dL   Calcium 9.8 8.9 - 10.3 mg/dL   GFR calc non Af Amer >60 >60 mL/min   GFR calc Af Amer >60 >60 mL/min    Comment: (NOTE) The eGFR has been calculated using the CKD EPI equation. This calculation has not been validated in all clinical situations. eGFR's persistently <60 mL/min signify possible Chronic Kidney Disease.    Anion gap 16 (H) 5 - 15  Hemoglobin A1c     Status: Abnormal   Collection Time: 06/09/15  9:26 PM  Result Value Ref Range   Hgb A1c MFr Bld 15.8 (H) 4.8 - 5.6 %    Comment: (NOTE)         Pre-diabetes: 5.7 - 6.4         Diabetes: >6.4         Glycemic control for adults with diabetes: <7.0    Mean Plasma Glucose 407 mg/dL    Comment: (NOTE) Performed At: [facility] [address] [place], [place] [id number] Lindon Romp MD IO:[id number]   CBG monitoring, ED     Status: Abnormal   Collection Time: 06/09/15  9:28 PM  Result Value Ref Range   Glucose-Capillary 470 (H) 65 - 99 mg/dL  I-Stat CG4 Lactic Acid, ED     Status: Abnormal   Collection  Time: 06/09/15  9:49 PM  Result Value Ref Range   Lactic Acid, Venous 3.36 (HH) 0.5 - 2.0 mmol/L   Comment NOTIFIED PHYSICIAN   CBG monitoring, ED     Status: Abnormal   Collection Time: 06/09/15 10:44 PM  Result Value Ref Range   Glucose-Capillary 302 (H) 65 - 99 mg/dL  CBG monitoring, ED     Status: Abnormal   Collection Time: 06/09/15 11:59 PM  Result Value Ref Range   Glucose-Capillary 200 (H)  65 - 99 mg/dL  Pregnancy, urine     Status: None   Collection Time: 06/10/15 12:29 AM  Result Value Ref Range   Preg Test, Ur NEGATIVE NEGATIVE    Comment:        THE SENSITIVITY OF THIS METHODOLOGY IS >20 mIU/mL.   Lactic acid, plasma     Status: Abnormal   Collection Time: 06/10/15  3:21 AM  Result Value Ref Range   Lactic Acid, Venous 2.3 (HH) 0.5 - 2.0 mmol/L    Comment: CRITICAL RESULT CALLED TO, READ BACK BY AND VERIFIED WITH: R.RONISENDIMAN,RN 0422 06/10/15 M.CAMPBELL   Comprehensive metabolic panel     Status: Abnormal   Collection Time: 06/10/15  5:15 AM  Result Value Ref Range   Sodium 135 135 - 145 mmol/L   Potassium 3.1 (L) 3.5 - 5.1 mmol/L   Chloride 102 101 - 111 mmol/L   CO2 26 22 - 32 mmol/L   Glucose, Bld 302 (H) 65 - 99 mg/dL   BUN <5 (L) 6 - 20 mg/dL   Creatinine, Ser 0.36 (L) 0.44 - 1.00 mg/dL   Calcium 7.9 (L) 8.9 - 10.3 mg/dL   Total Protein 5.6 (L) 6.5 - 8.1 g/dL   Albumin 2.3 (L) 3.5 - 5.0 g/dL   AST 17 15 - 41 U/L   ALT 12 (L) 14 - 54 U/L   Alkaline Phosphatase 117 38 - 126 U/L   Total Bilirubin 0.4 0.3 - 1.2 mg/dL   GFR calc non Af Amer >60 >60 mL/min   GFR calc Af Amer >60 >60 mL/min    Comment: (NOTE) The eGFR has been calculated using the CKD EPI equation. This calculation has not been validated in all clinical situations. eGFR's persistently <60 mL/min signify possible Chronic Kidney Disease.    Anion gap 7 5 - 15  Lactic acid, plasma     Status: None   Collection Time: 06/10/15  5:15 AM  Result Value Ref Range   Lactic Acid, Venous 1.6 0.5 - 2.0 mmol/L  CBC with Differential     Status: Abnormal   Collection Time: 06/10/15  5:15 AM  Result Value Ref Range   WBC 21.2 (H) 4.0 - 10.5 K/uL    Comment: REPEATED TO VERIFY   RBC 3.54 (L) 3.87 - 5.11 MIL/uL   Hemoglobin 10.4 (L) 12.0 - 15.0 g/dL    Comment: REPEATED TO VERIFY   HCT 31.5 (L) 36.0 - 46.0 %   MCV 89.0 78.0 - 100.0 fL   MCH 29.4 26.0 - 34.0 pg   MCHC 33.0 30.0 - 36.0 g/dL    RDW 13.6 11.5 - 15.5 %   Platelets 298 150 - 400 K/uL    Comment: REPEATED TO VERIFY   Neutrophils Relative % 75 %   Neutro Abs 15.9 (H) 1.7 - 7.7 K/uL   Lymphocytes Relative 17 %   Lymphs Abs 3.6 0.7 - 4.0 K/uL   Monocytes Relative 8 %   Monocytes Absolute 1.6 (H) 0.1 -  1.0 K/uL   Eosinophils Relative 1 %   Eosinophils Absolute 0.1 0.0 - 0.7 K/uL   Basophils Relative 0 %   Basophils Absolute 0.0 0.0 - 0.1 K/uL  Glucose, capillary     Status: Abnormal   Collection Time: 06/10/15  6:41 AM  Result Value Ref Range   Glucose-Capillary 244 (H) 65 - 99 mg/dL  Glucose, capillary     Status: Abnormal   Collection Time: 06/10/15 11:44 AM  Result Value Ref Range   Glucose-Capillary 341 (H) 65 - 99 mg/dL  Glucose, capillary     Status: Abnormal   Collection Time: 06/10/15  4:21 PM  Result Value Ref Range   Glucose-Capillary 167 (H) 65 - 99 mg/dL   Comment 1 Notify RN   Glucose, capillary     Status: Abnormal   Collection Time: 06/10/15  9:34 PM  Result Value Ref Range   Glucose-Capillary 347 (H) 65 - 99 mg/dL   Comment 1 Notify RN    Comment 2 Document in Chart     Dg Hand 2 View Left  06/10/2015  CLINICAL DATA:  Follow-up left index finger cellulitis, history of diabetes EXAM: LEFT HAND - 2 VIEW COMPARISON:  None. FINDINGS: Two views of the left hand submitted. No acute fracture or subluxation. No periosteal reaction or bony erosion. Mild soft tissue swelling noted distal aspect of the second finger. IMPRESSION: No acute fracture or subluxation. Mild soft tissue swelling second finger. No evidence of osteomyelitis. Electronically Signed   By: Lahoma Crocker M.D.   On: 06/10/2015 09:16   Mr Foot Right Wo Contrast  06/10/2015  CLINICAL DATA:  Pain and cellulitis of the second toe of the right foot. EXAM: MRI OF THE RIGHT FOREFOOT WITHOUT CONTRAST TECHNIQUE: Multiplanar, multisequence MR imaging was performed. No intravenous contrast was administered. COMPARISON:  Radiographs dated 06/09/2015  FINDINGS: There is destruction of the tuft of the distal phalanx of the second toe with abnormal signal throughout the distal phalangeal bone of the second toe with a soft tissue edema and swelling of the toe. Bandages in place. No joint effusions.  The other bones of the forefoot are normal. IMPRESSION: Osteomyelitis of the distal phalanx of the second toe with adjacent cellulitis. Electronically Signed   By: Lorriane Shire M.D.   On: 06/10/2015 09:32   Dg Foot Complete Right  06/09/2015  CLINICAL DATA:  Injury to right foot 2 days ago with swelling, erythema and pain. Initial encounter. EXAM: RIGHT FOOT COMPLETE - 3+ VIEW COMPARISON:  None. FINDINGS: There is no evidence of fracture or dislocation. There is no evidence of arthropathy or other focal bone abnormality. Soft tissues are unremarkable. IMPRESSION: Negative. Electronically Signed   By: Aletta Edouard M.D.   On: 06/09/2015 19:49    Review of Systems  All other systems reviewed and are negative.  Blood pressure 144/86, pulse 92, temperature 98.1 F (36.7 C), temperature source Oral, resp. rate 18, weight 62.7 kg (138 lb 3.7 oz), SpO2 97 %. Physical Exam On examination patient has a good dorsalis pedis pulse she has no venous stasis swelling there is cellulitis in her foot. Left foot has blistering ulceration cellulitis and radiographs shows osteomyelitis of the toe. Assessment/Plan: Assessment: Osteomyelitis abscess ulceration right foot second toe with uncontrolled diabetes with a good dorsalis pedis pulse.  Plan: We'll plan for a right foot second Ray amputation tomorrow on Friday afternoon. Continue IV antibiotics for at least 48 hours postoperatively patient may be discharged to home at that  time I will follow-up in the office in 1 week after surgery.  DUDA,MARCUS V 06/11/2015, 6:52 AM

## 2015-06-12 NOTE — Interval H&P Note (Signed)
History and Physical Interval Note:  06/12/2015 6:26 AM  Linda RocheJulie Barrell  has presented today for surgery, with the diagnosis of Osteomyelitis Right 2nd Toe  The various methods of treatment have been discussed with the patient and family. After consideration of risks, benefits and other options for treatment, the patient has consented to  Procedure(s): Right Foot 2nd Ray Amputation (Right) as a surgical intervention .  The patient's history has been reviewed, patient examined, no change in status, stable for surgery.  I have reviewed the patient's chart and labs.  Questions were answered to the patient's satisfaction.     Necole Minassian V

## 2015-06-12 NOTE — Progress Notes (Signed)
Orthopedic Tech Progress Note Patient Details:  Linda RocheJulie Poole 05/30/1965 [id number]  Ortho Devices Type of Ortho Device: Postop shoe/boot Ortho Device/Splint Location: RLE Ortho Device/Splint Interventions: Ordered, Application   Linda Poole, Linda Poole 06/12/2015, 10:49 PM

## 2015-06-12 NOTE — Anesthesia Procedure Notes (Addendum)
Anesthesia Regional Block:  Popliteal block  Pre-Anesthetic Checklist: ,, timeout performed, Correct Patient, Correct Site, Correct Laterality, Correct Procedure, Correct Position, site marked, Risks and benefits discussed, pre-op evaluation, post-op pain management  Laterality: Right  Prep: Maximum Sterile Barrier Precautions used and chloraprep       Needles:  Injection technique: Single-shot  Needle Type: Echogenic Stimulator Needle     Needle Length: 9cm 9 cm Needle Gauge: 21 and 21 G    Additional Needles:  Procedures: ultrasound guided (picture in chart) and nerve stimulator Popliteal block  Nerve Stimulator or Paresthesia:  Response: Peroneal,  Response: Tibial,   Additional Responses:   Narrative:  Start time: 06/12/2015 3:43 PM End time: 06/12/2015 3:53 PM Injection made incrementally with aspirations every 5 mL. Anesthesiologist: Gaynelle AduFITZGERALD, WILLIAM  Additional Notes: 2% Lidocaine skin wheel.    Procedure Name: MAC Date/Time: 06/12/2015 5:05 PM Performed by: Charm BargesBUTLER, Marisol Giambra R Pre-anesthesia Checklist: Patient identified, Emergency Drugs available, Suction available, Patient being monitored and Timeout performed Patient Re-evaluated:Patient Re-evaluated prior to inductionOxygen Delivery Method: Nasal cannula Placement Confirmation: positive ETCO2 Dental Injury: Teeth and Oropharynx as per pre-operative assessment

## 2015-06-12 NOTE — Progress Notes (Signed)
PROGRESS NOTE    Keauna Brasel AVW:[id number] DOB: [birth year] DOA: 06/09/2015 PCP: No PCP Per Patient  HPI/Brief narrative 50 year old female patient with history of DM 2, noncompliant with medications, tobacco abuse, presented with painful right second toe. In the ED, tachycardic, febrile, blood glucose in the 600 range without increased anion gap. MRI foot confirmed right second toe osteomyelitis. Orthopedics consulted and plan surgery on 1/13.   Assessment/Plan:   Sepsis, present on admission, secondary to right second toe osteomyelitis/cellulitis - Continue IV vancomycin and Zosyn - Orthopedics/Dr. Lajoyce Corners consulted and plans right foot secondary amputation on 1/13 PM. He recommends continued IV antibiotics for at least 48 hours postoperatively and then discharged home to follow-up with him in the office in one week postoperatively. - Seen prior to surgery this morning. Worsening swelling of right second toe-appears cystic/? Hemorrhagic versus infectious.  Left second finger paronychia - No acute findings on clinical exam or x-ray.  Uncontrolled type II DM with hyperosmolar state - Secondary to noncompliance with medications. - Briefly placed on insulin drip and then transitioned to Lantus and SSI. These will need titration as needed. - A1c: 15.8. We will adjust Lantus, SSI and add mealtime NovoLog. Will need to be discharged on insulin. - CBGs fluctuating-should be able to control better postoperatively after infectious source has been removed.  Tobacco abuse - Cessation counseled.  Hypokalemia - Replaced.  Anemia - Stable.  Hyponatremia - Secondary to dehydration and hyperglycemia. Improved. Resolved.  Chronic Methadone Rx  - High threshold for pain. We will adjust pain medications. - Has been on methadone due to history of prescription medication abuse.   DVT prophylaxis: SCDs Code Status: Full Family Communication: Discussed with brother-in-law and another female  family member at bedside Disposition Plan: DC home when medically stable, possibly in 2 days.   Consultants:  Orthopedic/Dr. Lajoyce Corners  Procedures:  None  Antimicrobials:  IV Zosyn 1/10 >  IV vancomycin 1/10 >   Subjective: Seen prior to surgery this morning. Worsening swelling and continued pain of right second toe.  Objective: Filed Vitals:   06/11/15 1224 06/11/15 2218 06/12/15 0455 06/12/15 0500  BP: 149/76 124/78 124/80   Pulse: 78 91 96   Temp: 98 F (36.7 C) 99.3 F (37.4 C) 98.8 F (37.1 C)   TempSrc:  Oral Oral   Resp: 18 16 16    Weight:    66.7 kg (147 lb 0.8 oz)  SpO2: 98% 95% 95%     Intake/Output Summary (Last 24 hours) at 06/12/15 0710 Last data filed at 06/11/15 1700  Gross per 24 hour  Intake   1440 ml  Output      0 ml  Net   1440 ml   Filed Weights   06/11/15 0500 06/12/15 0500  Weight: 62.7 kg (138 lb 3.7 oz) 66.7 kg (147 lb 0.8 oz)    Exam:  General exam: Pleasant middle-aged female sitting up in bed in no painful distress. Respiratory system: Clear. No increased work of breathing. Cardiovascular system: S1 & S2 heard, RRR. No JVD, murmurs, gallops, clicks or pedal edema. Gastrointestinal system: Abdomen is nondistended, soft and nontender. Normal bowel sounds heard. Central nervous system: Alert and oriented. No focal neurological deficits. Extremities: Symmetric 5 x 5 power. Right second toe swollen, black and blue discoloration, tender to touch and painful range of motion movements. Bounding dorsalis pedis. Please see pictures below. Worsening blisterlike swelling over proximal aspect of right second toe-appears cystic.        Data Reviewed:  Basic Metabolic Panel:  Recent Labs Lab 06/09/15 1915 06/09/15 2115 06/10/15 0515 06/11/15 0543  NA 127* 130* 135 139  K 3.5 3.3* 3.1* 3.9  CL 84* 87* 102 107  CO2 27 27 26 28   GLUCOSE 662* 527* 302* 271*  BUN <5* <5* <5* 5*  CREATININE 0.72 0.55 0.36* 0.42*  CALCIUM 10.1 9.8 7.9*  8.5*  MG  --   --   --  2.1   Liver Function Tests:  Recent Labs Lab 06/09/15 1915 06/10/15 0515  AST 15 17  ALT 15 12*  ALKPHOS 167* 117  BILITOT 0.5 0.4  PROT 8.2* 5.6*  ALBUMIN 3.6 2.3*   No results for input(s): LIPASE, AMYLASE in the last 168 hours. No results for input(s): AMMONIA in the last 168 hours. CBC:  Recent Labs Lab 06/09/15 1915 06/10/15 0515 06/12/15 0535  WBC 27.5* 21.2* 16.2*  NEUTROABS 22.2* 15.9*  --   HGB 13.9 10.4* 11.2*  HCT 40.8 31.5* 34.2*  MCV 89.5 89.0 91.0  PLT 339 298 308   Cardiac Enzymes: No results for input(s): CKTOTAL, CKMB, CKMBINDEX, TROPONINI in the last 168 hours. BNP (last 3 results) No results for input(s): PROBNP in the last 8760 hours. CBG:  Recent Labs Lab 06/11/15 0647 06/11/15 1157 06/11/15 1601 06/11/15 2218 06/12/15 0653  GLUCAP 194* 399* 96 276* 238*    Recent Results (from the past 240 hour(s))  Culture, blood (Routine X 2) w Reflex to ID Panel     Status: None (Preliminary result)   Collection Time: 06/09/15  9:00 PM  Result Value Ref Range Status   Specimen Description BLOOD LEFT FOREARM  Final   Special Requests BOTTLES DRAWN AEROBIC ONLY 10CC  Final   Culture NO GROWTH 1 DAY  Final   Report Status PENDING  Incomplete  Culture, blood (Routine X 2) w Reflex to ID Panel     Status: None (Preliminary result)   Collection Time: 06/09/15  9:14 PM  Result Value Ref Range Status   Specimen Description BLOOD LEFT ANTECUBITAL  Final   Special Requests BOTTLES DRAWN AEROBIC AND ANAEROBIC 5CC  Final   Culture NO GROWTH 1 DAY  Final   Report Status PENDING  Incomplete         Studies: Dg Hand 2 View Left  06/10/2015  CLINICAL DATA:  Follow-up left index finger cellulitis, history of diabetes EXAM: LEFT HAND - 2 VIEW COMPARISON:  None. FINDINGS: Two views of the left hand submitted. No acute fracture or subluxation. No periosteal reaction or bony erosion. Mild soft tissue swelling noted distal aspect of the  second finger. IMPRESSION: No acute fracture or subluxation. Mild soft tissue swelling second finger. No evidence of osteomyelitis. Electronically Signed   By: Natasha MeadLiviu  Pop M.D.   On: 06/10/2015 09:16   Mr Foot Right Wo Contrast  06/10/2015  CLINICAL DATA:  Pain and cellulitis of the second toe of the right foot. EXAM: MRI OF THE RIGHT FOREFOOT WITHOUT CONTRAST TECHNIQUE: Multiplanar, multisequence MR imaging was performed. No intravenous contrast was administered. COMPARISON:  Radiographs dated 06/09/2015 FINDINGS: There is destruction of the tuft of the distal phalanx of the second toe with abnormal signal throughout the distal phalangeal bone of the second toe with a soft tissue edema and swelling of the toe. Bandages in place. No joint effusions.  The other bones of the forefoot are normal. IMPRESSION: Osteomyelitis of the distal phalanx of the second toe with adjacent cellulitis. Electronically Signed   By: Fayrene FearingJames  Maxwell M.D.   On: 06/10/2015 09:32        Scheduled Meds: . docusate sodium  100 mg Oral BID  . Influenza vac split quadrivalent PF  0.5 mL Intramuscular Tomorrow-1000  . insulin aspart  0-15 Units Subcutaneous TID WC  . insulin aspart  0-5 Units Subcutaneous QHS  . insulin aspart  4 Units Subcutaneous TID WC  . insulin glargine  20 Units Subcutaneous QHS  . methadone  120 mg Oral Q24H  . nicotine  21 mg Transdermal Daily  . piperacillin-tazobactam (ZOSYN)  IV  3.375 g Intravenous 3 times per day  . vancomycin  750 mg Intravenous Q12H   Continuous Infusions:    Principal Problem:   Sepsis (HCC) Active Problems:   Diabetic foot infection (HCC)   Hyperosmolar non-ketotic state in patient with type 2 diabetes mellitus (HCC)    Time spent: 15 minutes    HONGALGI,ANAND, MD, FACP, FHM. [facility] Pager [phone]  If 7PM-7AM, please contact night-coverage [email or web address] Password [email or web address] 06/12/2015, 7:10 AM    LOS: 3 days

## 2015-06-13 LAB — CBC
HEMATOCRIT: 34 % — AB (ref 36.0–46.0)
HEMOGLOBIN: 10.9 g/dL — AB (ref 12.0–15.0)
MCH: 29.4 pg (ref 26.0–34.0)
MCHC: 32.1 g/dL (ref 30.0–36.0)
MCV: 91.6 fL (ref 78.0–100.0)
Platelets: 360 10*3/uL (ref 150–400)
RBC: 3.71 MIL/uL — AB (ref 3.87–5.11)
RDW: 14.1 % (ref 11.5–15.5)
WBC: 14.1 10*3/uL — AB (ref 4.0–10.5)

## 2015-06-13 LAB — GLUCOSE, CAPILLARY
GLUCOSE-CAPILLARY: 183 mg/dL — AB (ref 65–99)
GLUCOSE-CAPILLARY: 208 mg/dL — AB (ref 65–99)
Glucose-Capillary: 225 mg/dL — ABNORMAL HIGH (ref 65–99)
Glucose-Capillary: 192 mg/dL — ABNORMAL HIGH (ref 65–99)

## 2015-06-13 LAB — BASIC METABOLIC PANEL
ANION GAP: 12 (ref 5–15)
BUN: 6 mg/dL (ref 6–20)
CHLORIDE: 97 mmol/L — AB (ref 101–111)
CO2: 27 mmol/L (ref 22–32)
CREATININE: 0.48 mg/dL (ref 0.44–1.00)
Calcium: 8.9 mg/dL (ref 8.9–10.3)
GFR calc non Af Amer: >60 mL/min (ref >60)
Glucose, Bld: 230 mg/dL — ABNORMAL HIGH (ref 65–99)
POTASSIUM: 3.5 mmol/L (ref 3.5–5.1)
SODIUM: 136 mmol/L (ref 135–145)

## 2015-06-13 LAB — PROCALCITONIN: PROCALCITONIN: 0.1 ng/mL

## 2015-06-13 MED ORDER — OXYCODONE HCL 5 MG PO TABS
5.0000 mg | ORAL_TABLET | ORAL | Status: DC | PRN
  Administered 2015-06-13 – 2015-06-15 (×11): 10 mg via ORAL
  Filled 2015-06-13 (×11): qty 2

## 2015-06-13 MED ORDER — HYDROMORPHONE HCL 1 MG/ML IJ SOLN
1.0000 mg | INTRAMUSCULAR | Status: DC | PRN
  Administered 2015-06-13 – 2015-06-15 (×11): 1 mg via INTRAVENOUS
  Filled 2015-06-13 (×11): qty 1

## 2015-06-13 NOTE — Progress Notes (Signed)
PROGRESS NOTE    Linda Poole XBM:[id number] DOB: Apr 21, 1966 DOA: 06/09/2015 PCP: No PCP Per Patient  HPI/Brief narrative 50 year old female patient with history of DM 2, noncompliant with medications, tobacco abuse, presented with painful right second toe. In the ED, tachycardic, febrile, blood glucose in the 600 range without increased anion gap. MRI foot confirmed right second toe osteomyelitis. Orthopedics consulted and plan surgery on 1/13.   Assessment/Plan:   Sepsis, present on admission, secondary to right second toe osteomyelitis/cellulitis - Continue IV vancomycin and Zosyn - Orthopedics/Dr. Lajoyce Corners consulted and plans right foot secondary amputation on 1/13 PM. He recommends continued IV antibiotics for at least 48 hours postoperatively and then discharged home to follow-up with him in the office in one week postoperatively. - Status post right foot secondary amputation and local tissue rearrangement for wound closure on 06/12/15 - Management per orthopedics. - Pain better controlled.  Left second finger paronychia - No acute findings on clinical exam or x-ray.  Uncontrolled type II DM with hyperosmolar state - Secondary to noncompliance with medications. - Briefly placed on insulin drip and then transitioned to Lantus and SSI. These will need titration as needed. - A1c: 15.8. We will adjust Lantus, SSI and add mealtime NovoLog. Will need to be discharged on insulin. - CBGs fluctuating but improving control compared to preoperatively.  Tobacco abuse - Cessation counseled.  Hypokalemia - Replaced.  Anemia - Stable.  Hyponatremia - Secondary to dehydration and hyperglycemia. Improved. Resolved.  Chronic Methadone Rx  - High threshold for pain. We will adjust pain medications. - Has been on methadone due to history of prescription medication abuse.   DVT prophylaxis: SCDs Code Status: Full Family Communication: None at bedside Disposition Plan: DC home when  medically stable, possibly in 2 days.   Consultants:  Orthopedic/Dr. Lajoyce Corners  Procedures:  Status post right foot secondary amputation and local tissue rearrangement for wound closure on 06/12/15  Antimicrobials:  IV Zosyn 1/10 >  IV vancomycin 1/10 >   Subjective: Right foot pain better compared to preoperatively. Denies any other complaints. As per RN, no acute events.  Objective: Filed Vitals:   06/12/15 1915 06/12/15 1925 06/12/15 2034 06/13/15 0530  BP: 116/80  143/93 108/75  Pulse: 85 88 97 95  Temp:  98.1 F (36.7 C) 98.5 F (36.9 C) 98.4 F (36.9 C)  TempSrc:   Oral Oral  Resp: 12 16 18 18   Weight:    64.2 kg (141 lb 8.6 oz)  SpO2: 93% 93% 98% 94%    Intake/Output Summary (Last 24 hours) at 06/13/15 1406 Last data filed at 06/12/15 1731  Gross per 24 hour  Intake    150 ml  Output      0 ml  Net    150 ml   Filed Weights   06/11/15 0500 06/12/15 0500 06/13/15 0530  Weight: 62.7 kg (138 lb 3.7 oz) 66.7 kg (147 lb 0.8 oz) 64.2 kg (141 lb 8.6 oz)    Exam:  General exam: Pleasant middle-aged female sitting up in bed in no painful distress. Respiratory system: Clear. No increased work of breathing. Cardiovascular system: S1 & S2 heard, RRR. No JVD, murmurs, gallops, clicks or pedal edema. Gastrointestinal system: Abdomen is nondistended, soft and nontender. Normal bowel sounds heard. Central nervous system: Alert and oriented. No focal neurological deficits. Extremities: Symmetric 5 x 5 power. Right foot postop dressing clean and dry.        Data Reviewed: Basic Metabolic Panel:  Recent Labs Lab 06/09/15  1915 06/09/15 2115 06/10/15 0515 06/11/15 0543 06/13/15 0555  NA 127* 130* 135 139 136  K 3.5 3.3* 3.1* 3.9 3.5  CL 84* 87* 102 107 97*  CO2 27 27 26 28 27   GLUCOSE 662* 527* 302* 271* 230*  BUN <5* <5* <5* 5* 6  CREATININE 0.72 0.55 0.36* 0.42* 0.48  CALCIUM 10.1 9.8 7.9* 8.5* 8.9  MG  --   --   --  2.1  --    Liver Function  Tests:  Recent Labs Lab 06/09/15 1915 06/10/15 0515  AST 15 17  ALT 15 12*  ALKPHOS 167* 117  BILITOT 0.5 0.4  PROT 8.2* 5.6*  ALBUMIN 3.6 2.3*   No results for input(s): LIPASE, AMYLASE in the last 168 hours. No results for input(s): AMMONIA in the last 168 hours. CBC:  Recent Labs Lab 06/09/15 1915 06/10/15 0515 06/12/15 0535 06/13/15 0555  WBC 27.5* 21.2* 16.2* 14.1*  NEUTROABS 22.2* 15.9*  --   --   HGB 13.9 10.4* 11.2* 10.9*  HCT 40.8 31.5* 34.2* 34.0*  MCV 89.5 89.0 91.0 91.6  PLT 339 298 308 360   Cardiac Enzymes: No results for input(s): CKTOTAL, CKMB, CKMBINDEX, TROPONINI in the last 168 hours. BNP (last 3 results) No results for input(s): PROBNP in the last 8760 hours. CBG:  Recent Labs Lab 06/12/15 1625 06/12/15 1759 06/12/15 2142 06/13/15 0612 06/13/15 1138  GLUCAP 218* 149* 221* 225* 192*    Recent Results (from the past 240 hour(s))  Culture, blood (Routine X 2) w Reflex to ID Panel     Status: None (Preliminary result)   Collection Time: 06/09/15  9:00 PM  Result Value Ref Range Status   Specimen Description BLOOD LEFT FOREARM  Final   Special Requests BOTTLES DRAWN AEROBIC ONLY 10CC  Final   Culture NO GROWTH 2 DAYS  Final   Report Status PENDING  Incomplete  Culture, blood (Routine X 2) w Reflex to ID Panel     Status: None (Preliminary result)   Collection Time: 06/09/15  9:14 PM  Result Value Ref Range Status   Specimen Description BLOOD LEFT ANTECUBITAL  Final   Special Requests BOTTLES DRAWN AEROBIC AND ANAEROBIC 5CC  Final   Culture NO GROWTH 2 DAYS  Final   Report Status PENDING  Incomplete  Surgical pcr screen     Status: None   Collection Time: 06/12/15  6:04 AM  Result Value Ref Range Status   MRSA, PCR NEGATIVE NEGATIVE Final   Staphylococcus aureus NEGATIVE NEGATIVE Final    Comment:        The Xpert SA Assay (FDA approved for NASAL specimens in patients over 61 years of age), is one component of a comprehensive  surveillance program.  Test performance has been validated by [facility] for patients greater than or equal to 86 year old. It is not intended to diagnose infection nor to guide or monitor treatment.          Studies: No results found.      Scheduled Meds: . docusate sodium  100 mg Oral BID  . insulin aspart  0-15 Units Subcutaneous TID WC  . insulin aspart  0-5 Units Subcutaneous QHS  . insulin aspart  4 Units Subcutaneous TID WC  . insulin glargine  20 Units Subcutaneous QHS  . methadone  120 mg Oral Q24H  . nicotine  21 mg Transdermal Daily  . piperacillin-tazobactam (ZOSYN)  IV  3.375 g Intravenous 3 times per day  .  vancomycin  750 mg Intravenous Q12H   Continuous Infusions: . sodium chloride 10 mL/hr at 06/12/15 2304    Principal Problem:   Sepsis (HCC) Active Problems:   Diabetic foot infection (HCC)   Hyperosmolar non-ketotic state in patient with type 2 diabetes mellitus (HCC)    Time spent: 15 minutes    Glendon Fiser, MD, FACP, FHM. [facility] Pager [phone]  If 7PM-7AM, please contact night-coverage [email or web address] Password [email or web address] 06/13/2015, 2:06 PM    LOS: 4 days

## 2015-06-13 NOTE — Evaluation (Signed)
Physical Therapy Evaluation Patient Details Name: Linda Poole MRN: [id number] DOB: 1965-12-25 Today's Date: 06/13/2015   History of Present Illness  50 y.o. 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 Hx of Diabetes mellitus   Clinical Impression  Patient is s/p above surgery presenting with functional limitations due to the deficits listed below (see PT Problem List). Demonstrates fairly good control of rolling walker for gait, with intermittent cues to maintain NWB on RLE. Plans to stay with friend at discharge. Will need to be able to navigate 4 steps which we will practice tomorrow. Educated on LE elevation and safety with mobility. Patient will benefit from skilled PT to increase their independence and safety with mobility to allow discharge to the venue listed below.       Follow Up Recommendations No PT follow up;Supervision - Intermittent    Equipment Recommendations  Rolling walker with 5" wheels    Recommendations for Other Services       Precautions / Restrictions Precautions Precautions: None Required Braces or Orthoses: Other Brace/Splint Other Brace/Splint: post op shoe Restrictions Weight Bearing Restrictions: Yes RLE Weight Bearing: Non weight bearing      Mobility  Bed Mobility Overal bed mobility: Independent                Transfers Overall transfer level: Needs assistance Equipment used: Rolling walker (2 wheeled) Transfers: Sit to/from Stand Sit to Stand: Supervision         General transfer comment: Supervision for safety. Maintains NWB on RLE. VC for hand placement. Performed from bed x3 and toilet with use of rail.  Ambulation/Gait Ambulation/Gait assistance: Supervision Ambulation Distance (Feet): 25 Feet Assistive device: Rolling walker (2 wheeled) Gait Pattern/deviations: Step-to pattern   Gait velocity interpretation: at or above normal speed for age/gender General Gait Details: Educated on safe DME use with a rolling walker.  Mobilizes with this devices fairly well. Intermittently needs cues to prevent touch down with Rt foot. No overt loss of balance. Cues to take her time and slowly turn RW.  Stairs            Wheelchair Mobility    Modified Rankin (Stroke Patients Only)       Balance Overall balance assessment: Needs assistance Sitting-balance support: No upper extremity supported;Feet supported Sitting balance-Leahy Scale: Good     Standing balance support: No upper extremity supported Standing balance-Leahy Scale: Fair Standing balance comment: Stood on single LE without UE support.                             Pertinent Vitals/Pain Pain Assessment: 0-10 Pain Score: 7  Pain Location: Lt foot Pain Descriptors / Indicators: Aching;Burning Pain Intervention(s): Monitored during session;Repositioned    Home Living Family/patient expects to be discharged to:: Private residence Living Arrangements: Other relatives (sister) Available Help at Discharge: Friend(s);Available 24 hours/day (Plans to stay at a friends house until recovered) Type of Home: House Home Access: Level entry (No steps at friend's house where she is staying)     Home Layout: Two level Home Equipment: None      Prior Function Level of Independence: Independent               Hand Dominance   Dominant Hand: Right    Extremity/Trunk Assessment   Upper Extremity Assessment: Defer to OT evaluation           Lower Extremity Assessment: RLE deficits/detail RLE Deficits / Details: Bandaged. Able  to move toes, and reports feeling of light touch sensation. 3rd digit dusky appearance.       Communication   Communication: No difficulties  Cognition Arousal/Alertness: Awake/alert Behavior During Therapy: WFL for tasks assessed/performed Overall Cognitive Status: Within Functional Limits for tasks assessed                      General Comments General comments (skin integrity, edema,  etc.): Cues for safety. Food arrived and pt wanted to eat instead of practicing stair navigation. Will follow up tomorrow    Exercises General Exercises - Lower Extremity Ankle Circles/Pumps: AROM;Right;10 reps (limited ROM due to bandage)      Assessment/Plan    PT Assessment Patient needs continued PT services  PT Diagnosis Difficulty walking;Abnormality of gait;Acute pain   PT Problem List Decreased strength;Decreased activity tolerance;Decreased balance;Decreased mobility;Decreased knowledge of use of DME;Decreased knowledge of precautions;Pain  PT Treatment Interventions DME instruction;Gait training;Stair training;Functional mobility training;Therapeutic exercise;Therapeutic activities;Balance training;Patient/family education   PT Goals (Current goals can be found in the Care Plan section) Acute Rehab PT Goals Patient Stated Goal: Go stay with my friend PT Goal Formulation: With patient Time For Goal Achievement: 06/27/15 Potential to Achieve Goals: Good    Frequency Min 3X/week   Barriers to discharge        Co-evaluation               End of Session   Activity Tolerance: Patient tolerated treatment well Patient left: in chair;with call bell/phone within reach Nurse Communication: Mobility status;Weight bearing status         Time: 1250-1309 PT Time Calculation (min) (ACUTE ONLY): 19 min   Charges:   PT Evaluation $PT Eval Low Complexity: 1 Procedure     PT G CodesBerton Mount:        Ether Wolters S 06/13/2015, 2:37 PM  Sunday SpillersLogan Secor Bell CenterBarbour, [place] CarolinaPT [phone]

## 2015-06-13 NOTE — Anesthesia Postprocedure Evaluation (Signed)
Anesthesia Post Note  Patient: Linda RocheJulie Pettitt  Procedure(s) Performed: Procedure(s) (LRB): Right Foot 2nd Ray Amputation (Right)  Patient location during evaluation: PACU Anesthesia Type: General Level of consciousness: sedated Pain management: pain level controlled Vital Signs Assessment: post-procedure vital signs reviewed and stable Respiratory status: spontaneous breathing and respiratory function stable Cardiovascular status: stable Anesthetic complications: no    Jrue Yambao DANIEL

## 2015-06-14 LAB — GLUCOSE, CAPILLARY
GLUCOSE-CAPILLARY: 187 mg/dL — AB (ref 65–99)
GLUCOSE-CAPILLARY: 186 mg/dL — AB (ref 65–99)
GLUCOSE-CAPILLARY: 195 mg/dL — AB (ref 65–99)
Glucose-Capillary: 157 mg/dL — ABNORMAL HIGH (ref 65–99)

## 2015-06-14 LAB — CBC
HEMATOCRIT: 35.3 % — AB (ref 36.0–46.0)
Hemoglobin: 11.5 g/dL — ABNORMAL LOW (ref 12.0–15.0)
MCH: 29.8 pg (ref 26.0–34.0)
MCHC: 32.6 g/dL (ref 30.0–36.0)
MCV: 91.5 fL (ref 78.0–100.0)
PLATELETS: 366 10*3/uL (ref 150–400)
RBC: 3.86 MIL/uL — ABNORMAL LOW (ref 3.87–5.11)
RDW: 14.1 % (ref 11.5–15.5)
WBC: 10.2 10*3/uL (ref 4.0–10.5)

## 2015-06-14 MED ORDER — PNEUMOCOCCAL VAC POLYVALENT 25 MCG/0.5ML IJ INJ
0.5000 mL | INJECTION | INTRAMUSCULAR | Status: AC
  Administered 2015-06-15: 0.5 mL via INTRAMUSCULAR
  Filled 2015-06-14: qty 0.5

## 2015-06-14 NOTE — Progress Notes (Signed)
PROGRESS NOTE    Linda Poole ZOX:[id number] DOB: Oct 21, 1965 DOA: 06/09/2015 PCP: No PCP Per Patient  HPI/Brief narrative 50 year old female patient with history of DM 2, noncompliant with medications, tobacco abuse, presented with painful right second toe. In the ED, tachycardic, febrile, blood glucose in the 600 range without increased anion gap. MRI foot confirmed right second toe osteomyelitis. Orthopedics consulted s/p R foot 2nd ray amputation 1/13. Possible DC 1/16  Assessment/Plan:   Sepsis, present on admission, secondary to right second toe osteomyelitis/cellulitis - Continue IV vancomycin and Zosyn - Orthopedics consulted & s/p R foot 2nd ray amputation 1/13. Possible DC 1/16. Await Ortho input re Abx at DC. - Per PT, OP PT.  Left second finger paronychia - No acute findings on clinical exam or x-ray.  Uncontrolled type II DM with hyperosmolar state - Secondary to noncompliance with medications. - Briefly placed on insulin drip and then transitioned to Lantus and SSI. These will need titration as needed. - A1c: 15.8. We will adjust Lantus, SSI and add mealtime NovoLog. Will need to be discharged on insulin. - CBGs fluctuating but improving control compared to preoperatively.  Tobacco abuse - Cessation counseled.  Hypokalemia - Replaced.  Anemia - Stable.  Hyponatremia - Secondary to dehydration and hyperglycemia. Improved. Resolved.  Chronic Methadone Rx  - High threshold for pain. We will adjust pain medications. - Has been on methadone due to history of prescription medication abuse.   DVT prophylaxis: SCDs Code Status: Full Family Communication: None at bedside Disposition Plan: DC home when medically stable, possibly 1/16   Consultants:  Orthopedic/Dr. Lajoyce Corners  Procedures:  Status post right foot second ray amputation and local tissue rearrangement for wound closure on 06/12/15  Antimicrobials:  IV Zosyn 1/10 >  IV vancomycin 1/10 >    Subjective: Right foot pain better compared to preoperatively. Denies any other complaints. As per RN, no acute events.  Objective: Filed Vitals:   06/13/15 0530 06/13/15 1415 06/13/15 2039 06/14/15 0734  BP: 108/75 132/80 111/73 120/74  Pulse: 95 96 87 86  Temp: 98.4 F (36.9 C) 98.4 F (36.9 C) 98.1 F (36.7 C) 98.1 F (36.7 C)  TempSrc: Oral  Oral   Resp: 18 18 18 18   Weight: 64.2 kg (141 lb 8.6 oz)     SpO2: 94% 95% 96% 99%   No intake or output data in the 24 hours ending 06/14/15 1318 Filed Weights   06/11/15 0500 06/12/15 0500 06/13/15 0530  Weight: 62.7 kg (138 lb 3.7 oz) 66.7 kg (147 lb 0.8 oz) 64.2 kg (141 lb 8.6 oz)    Exam:  General exam: Pleasant middle-aged female seen comfortably getting up from bed and getting into chair this AM. Respiratory system: Clear. No increased work of breathing. Cardiovascular system: S1 & S2 heard, RRR. No JVD, murmurs, gallops, clicks or pedal edema. Gastrointestinal system: Abdomen is nondistended, soft and nontender. Normal bowel sounds heard. Central nervous system: Alert and oriented. No focal neurological deficits. Extremities: Symmetric 5 x 5 power. Right foot postop dressing clean and dry.        Data Reviewed: Basic Metabolic Panel:  Recent Labs Lab 06/09/15 1915 06/09/15 2115 06/10/15 0515 06/11/15 0543 06/13/15 0555  NA 127* 130* 135 139 136  K 3.5 3.3* 3.1* 3.9 3.5  CL 84* 87* 102 107 97*  CO2 27 27 26 28 27   GLUCOSE 662* 527* 302* 271* 230*  BUN <5* <5* <5* 5* 6  CREATININE 0.72 0.55 0.36* 0.42* 0.48  CALCIUM  10.1 9.8 7.9* 8.5* 8.9  MG  --   --   --  2.1  --    Liver Function Tests:  Recent Labs Lab 06/09/15 1915 06/10/15 0515  AST 15 17  ALT 15 12*  ALKPHOS 167* 117  BILITOT 0.5 0.4  PROT 8.2* 5.6*  ALBUMIN 3.6 2.3*   No results for input(s): LIPASE, AMYLASE in the last 168 hours. No results for input(s): AMMONIA in the last 168 hours. CBC:  Recent Labs Lab 06/09/15 1915  06/10/15 0515 06/12/15 0535 06/13/15 0555 06/14/15 0440  WBC 27.5* 21.2* 16.2* 14.1* 10.2  NEUTROABS 22.2* 15.9*  --   --   --   HGB 13.9 10.4* 11.2* 10.9* 11.5*  HCT 40.8 31.5* 34.2* 34.0* 35.3*  MCV 89.5 89.0 91.0 91.6 91.5  PLT 339 298 308 360 366   Cardiac Enzymes: No results for input(s): CKTOTAL, CKMB, CKMBINDEX, TROPONINI in the last 168 hours. BNP (last 3 results) No results for input(s): PROBNP in the last 8760 hours. CBG:  Recent Labs Lab 06/13/15 1138 06/13/15 1605 06/13/15 2150 06/14/15 0714 06/14/15 1125  GLUCAP 192* 183* 208* 187* 186*    Recent Results (from the past 240 hour(s))  Culture, blood (Routine X 2) w Reflex to ID Panel     Status: None (Preliminary result)   Collection Time: 06/09/15  9:00 PM  Result Value Ref Range Status   Specimen Description BLOOD LEFT FOREARM  Final   Special Requests BOTTLES DRAWN AEROBIC ONLY 10CC  Final   Culture NO GROWTH 3 DAYS  Final   Report Status PENDING  Incomplete  Culture, blood (Routine X 2) w Reflex to ID Panel     Status: None (Preliminary result)   Collection Time: 06/09/15  9:14 PM  Result Value Ref Range Status   Specimen Description BLOOD LEFT ANTECUBITAL  Final   Special Requests BOTTLES DRAWN AEROBIC AND ANAEROBIC 5CC  Final   Culture NO GROWTH 3 DAYS  Final   Report Status PENDING  Incomplete  Surgical pcr screen     Status: None   Collection Time: 06/12/15  6:04 AM  Result Value Ref Range Status   MRSA, PCR NEGATIVE NEGATIVE Final   Staphylococcus aureus NEGATIVE NEGATIVE Final    Comment:        The Xpert SA Assay (FDA approved for NASAL specimens in patients over 38 years of age), is one component of a comprehensive surveillance program.  Test performance has been validated by [facility] for patients greater than or equal to 85 year old. It is not intended to diagnose infection nor to guide or monitor treatment.   Anaerobic culture     Status: None (Preliminary result)   Collection  Time: 06/12/15  5:21 PM  Result Value Ref Range Status   Specimen Description ABSCESS RIGHT FOOT  Final   Special Requests PATIENT ON FOLLOWING VANC AND ZOSYN  Final   Gram Stain PENDING  Incomplete   Culture   Final    NO ANAEROBES ISOLATED; CULTURE IN PROGRESS FOR 5 DAYS Performed at [facility]    Report Status PENDING  Incomplete  Fungus Culture with Smear     Status: None (Preliminary result)   Collection Time: 06/12/15  5:21 PM  Result Value Ref Range Status   Specimen Description ABSCESS RIGHT FOOT  Final   Special Requests PATIENT ON FOLLOWING VANC AND ZOSYN  Final   Fungal Smear   Final    NO YEAST OR FUNGAL ELEMENTS  SEEN [facility]formed [facility]    Culture   Final    CULTURE IN PROGRESS FOR FOUR WEEKS [facility]formed at [facility]    Report Status PENDING  Incomplete  AFB culture with smear     Status: None (Preliminary result)   Collection Time: 06/12/15  5:21 PM  Result Value Ref Range Status   Specimen Description ABSCESS RIGHT FOOT  Final   Special Requests PATIENT ON FOLLOWING VANC AND ZOSYN  Final   Acid Fast Smear   Final    NO ACID FAST BACILLI SEEN [facility]formed at [facility]    Culture   Final    CULTURE WILL BE EXAMINED FOR 6 WEEKS BEFORE ISSUING A FINAL REPORT [facility]formed at [facility]    Report Status PENDING  Incomplete         Studies: No results found.      Scheduled Meds: . docusate sodium  100 mg Oral BID  . insulin aspart  0-15 Units Subcutaneous TID WC  . insulin aspart  0-5 Units Subcutaneous QHS  . insulin aspart  4 Units Subcutaneous TID WC  . insulin glargine  20 Units Subcutaneous QHS  . methadone  120 mg Oral Q24H  . nicotine  21 mg Transdermal Daily  . piperacillin-tazobactam (ZOSYN)  IV  3.375 g Intravenous 3 times per day  . vancomycin  750 mg Intravenous Q12H   Continuous Infusions: . sodium chloride 10 mL/hr at 06/12/15 2304    Principal Problem:   Sepsis (HCC) Active  Problems:   Diabetic foot infection (HCC)   Hyperosmolar non-ketotic state in patient with type 2 diabetes mellitus (HCC)    Time spent: 15 minutes    HONGALGI,ANAND, MD, FACP, FHM. [facility] Pager [phone]  If 7PM-7AM, please contact night-coverage [email or web address] Password [email or web address] 06/14/2015, 1:18 PM    LOS: 5 days

## 2015-06-14 NOTE — Progress Notes (Signed)
Physical Therapy Treatment Patient Details Name: Linda Poole MRN: [id number] DOB: April 10, 1966 Today's Date: 06/14/2015    History of Present Illness 50 y.o. 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 Hx of Diabetes mellitus     PT Comments    More anxious today, and noted slightly more impulsive and quick moving; Still, moving well, only touching down with more difficult/taxing acts like turns and stairs; Discussed and practiced two ways of getting up steps in her friends' home; she tells me there will be someone there 24 hours (may not be able to give physical assist, but can setup things if needed)   Follow Up Recommendations  Outpatient PT;Supervision - Intermittent  The potential need for Outpatient PT can be addressed at Ortho follow-up appointments.      Equipment Recommendations  Rolling walker with 5" wheels;3in1 (PT)    Recommendations for Other Services       Precautions / Restrictions Precautions Precautions: None Required Braces or Orthoses: Other Brace/Splint Other Brace/Splint: post op shoe Restrictions RLE Weight Bearing: Non weight bearing    Mobility  Bed Mobility Overal bed mobility: Independent                Transfers Overall transfer level: Needs assistance Equipment used: Rolling walker (2 wheeled) Transfers: Sit to/from Stand Sit to Stand: Supervision         General transfer comment: Supervision for safety. Maintains NWB on RLE. VC for hand placement. Stood from bed, recliner, and mat table  Ambulation/Gait Ambulation/Gait assistance: Supervision Ambulation Distance (Feet): 20 Feet (altogether, limited gait to multiple short distances due to pain) Assistive device: Rolling walker (2 wheeled) Gait Pattern/deviations: Step-to pattern     General Gait Details: Educated on safe DME use with a rolling walker. Mobilizes with this devices fairly well. Intermittently needs cues to prevent touch down with Rt foot. No overt loss of balance. Cues  to take her time and slowly turn RW.   Stairs Stairs: Yes Stairs assistance: Min assist Stair Management: No rails;Step to pattern;Backwards;With walker Number of Stairs: 2 General stair comments: Step-by-step cues for sequence; needing continula cues to keep NWB R foot with  stairs, but making the step up with LLE well (despite being anxious); Handout provided with instructions; Discussed also tying a towel around her waist and bumping up the steps on her bottom; We then discussed and problem- solved through getting to a stand at the top of the stairs;  Pt then performed a floor to sit transfer with minguard assist and verbal and demo cues  Wheelchair Mobility    Modified Rankin (Stroke Patients Only)       Balance     Sitting balance-Leahy Scale: Good       Standing balance-Leahy Scale: Fair                      Cognition Arousal/Alertness: Awake/alert Behavior During Therapy: WFL for tasks assessed/performed Overall Cognitive Status: Within Functional Limits for tasks assessed                      Exercises      General Comments General comments (skin integrity, edema, etc.): Noted a regular Coke and syringe in teh top drawer of her chext of drawers; Pt was somewhat defensive when I mentioned that I noticed them, so I didn't press the issue; Herbert Deaner, RN immediately      Pertinent Vitals/Pain Pain Assessment: 0-10 Pain Score: 8  Pain Location: R  foot Pain Descriptors / Indicators: Aching;Discomfort;Grimacing;Guarding;Squeezing Pain Intervention(s): Limited activity within patient's tolerance;Monitored during session;RN gave pain meds during session    Home Living                      Prior Function            PT Goals (current goals can now be found in the care plan section) Acute Rehab PT Goals Patient Stated Goal: Go stay with my friend PT Goal Formulation: With patient Time For Goal Achievement: 06/27/15 Potential to Achieve  Goals: Good Progress towards PT goals: Progressing toward goals    Frequency  Min 3X/week    PT Plan Current plan remains appropriate (would add considering Outpatient PT when she can WBAT)    Co-evaluation             End of Session Equipment Utilized During Treatment: Gait belt Activity Tolerance: Patient tolerated treatment well Patient left: in chair;with call bell/phone within reach     Time: 0805-0836 PT Time Calculation (min) (ACUTE ONLY): 31 min  Charges:  $Gait Training: 8-22 mins $Therapeutic Activity: 8-22 mins                    G Codes:      Olen PelGarrigan, Delaine Hernandez Hamff 06/14/2015, 8:55 AM  Van ClinesHolly Larrissa Stivers, PT  Acute Rehabilitation Services Pager [phone] Office [phone]

## 2015-06-15 ENCOUNTER — Encounter (HOSPITAL_COMMUNITY): Payer: Self-pay | Admitting: Orthopedic Surgery

## 2015-06-15 LAB — CULTURE, ROUTINE-ABSCESS

## 2015-06-15 LAB — CULTURE, BLOOD (ROUTINE X 2)
CULTURE: NO GROWTH
Culture: NO GROWTH

## 2015-06-15 LAB — GLUCOSE, CAPILLARY
GLUCOSE-CAPILLARY: 286 mg/dL — AB (ref 65–99)
Glucose-Capillary: 134 mg/dL — ABNORMAL HIGH (ref 65–99)

## 2015-06-15 MED ORDER — INSULIN PEN NEEDLE 31G X 5 MM MISC: Status: DC

## 2015-06-15 MED ORDER — OXYCODONE HCL 5 MG PO TABS: 5.0000 mg | ORAL_TABLET | ORAL | Status: AC | PRN

## 2015-06-15 MED ORDER — INSULIN ASPART 100 UNIT/ML FLEXPEN: 0.0000 [IU] | PEN_INJECTOR | Freq: Three times a day (TID) | SUBCUTANEOUS | Status: DC

## 2015-06-15 MED ORDER — METFORMIN HCL 500 MG PO TABS: 1000.0000 mg | ORAL_TABLET | Freq: Two times a day (BID) | ORAL | Status: DC

## 2015-06-15 MED ORDER — INSULIN DETEMIR 100 UNIT/ML FLEXPEN: 20.0000 [IU] | PEN_INJECTOR | Freq: Every day | SUBCUTANEOUS | Status: AC

## 2015-06-15 MED ORDER — AMOXICILLIN 500 MG PO TABS: 500.0000 mg | ORAL_TABLET | Freq: Three times a day (TID) | ORAL | Status: DC

## 2015-06-15 NOTE — Progress Notes (Addendum)
Inpatient Diabetes Program Recommendations  AACE/ADA: New Consensus Statement on Inpatient Glycemic Control (2015)  Target Ranges:  Prepandial:   less than 140 mg/dL      Peak postprandial:   less than 180 mg/dL (1-2 hours)      Critically ill patients:  140 - 180 mg/dL   Spoke with patient about diabetes and home regimen for diabetes control. Patient has been diagnosed with DM 2 for 3 years. Patient reports that she currently takes Metformin 1,000 mg BID not 500 mg as listed on the home med rec.  Inquired about knowledge about A1C and patient reports that she does not know what an A1C is. Discussed A1C results (15.8% on 06/09/15) and explained what an A1C is, basic pathophysiology of DM Type 2, basic home care, importance of checking CBGs and maintaining good CBG control to prevent long-term and short-term complications. Discussed impact of nutrition, exercise, stress, sickness, and medications on diabetes control.  Discussed carbohydrates, carbohydrate goals per day and meal, along with portion sizes. Gave patient "Diabetes Meal Planning Guide"  And knowing your numbers sheet. Per Case Manager [facility]. [facility] today they have samples of Levemir and Toujeo insulin pens. MD at time of discharge if desire is to d/c on insulin, please order Levemir flexpen and insulin pen needles (order # [phone]). Spoke with patient and directed her to pick up insulin sample when [facility].   Please also adjust d/c dose of Metformin to home dose, Metformin 1,000 mg BID.  Thanks,  Christena DeemShannon Generoso Cropper RN, MSN, [facility] Inpatient Diabetes Coordinator Team Pager [phone] (8a-5p)

## 2015-06-15 NOTE — Care Management Note (Signed)
Case Management Note  Patient Details  Name: Linda Poole MRN: [id number] Date of Birth: 12/28/1965  Subjective/Objective:           Admitted with right second toe infection, s/p right second ray amputation 06/12/15           Action/Plan: Spoke with patient about need for PCP, [facility], for 07/03/15 at 2:15pm. Gave patient appointment information. Spoke with patient about medication cost, completed MATCH program for patient. Explained program and gave her pharmacy list and letter so she will be able to get new prescriptions at $3 each. Contacted [facility] with [facility] and requested rolling walker and 3N1 be delivered to patient's room. Patient stated that she will have family available to pick her up and assist her after discharge.       Expected Discharge Date:                  Expected Discharge Plan:  Home/Self Care  In-House Referral:  Financial Counselor  Discharge planning Services  CM Consult, MATCH Program, [facility]  Post Acute Care Choice:  Durable Medical Equipment Choice offered to:     DME Arranged:  3-N-1, Walker rolling DME Agency:  [facility]  [facility]     Status of Service:  In process, will continue to follow  Medicare Important Message Given:    Date Medicare IM Given:    Medicare IM give by:    Date Additional Medicare IM Given:    Additional Medicare Important Message give by:     If discussed at Long Length of Stay Meetings, dates discussed:    Additional Comments:  Monica BectonKrieg, Ezrah Panning Watson, RN 06/15/2015, 1:33 PM

## 2015-06-15 NOTE — Progress Notes (Signed)
Pharmacy Antibiotic Follow-up Note  Linda Poole is a 50 y.o. year-old female admitted on 06/09/2015.  The patient is currently on day 4 of Vanco/Zosyn for osteo.  Assessment/Plan: 49 yof with wound to 2nd digit on R foot, swelling, redness.  Afeb, wbc 27.5, SCr 0.72, CrCl~61 on admit. Recently treated with keflex for similar wound to R finger.  On day #7 of IV abx for diabetic foot infection involving 2 toe of R foot. MRI sx for osteomyelitis of the distal phalanx of the second toe with adjacent cellulitis. WBC 27.5>21.2>10.2, Scr 0.48, eCrCl 70-75 ml/min.  1/10 vancomycin >>  1/11 Zosyn >>   1/10 BCx: ngtd 1/13 Fungal cx: ip 1/13 anaerobic cx: ip 1/13 abscess cx: Group B strep  Plan: -Vancomycin 750 mg q12h -Zosyn 3.375g IV q8h -Will obtain VT tomorrow morning  -F/u transition to PO abx    Temp (24hrs), Avg:98.6 F (37 C), Min:98.2 F (36.8 C), Max:99.1 F (37.3 C)   Recent Labs Lab 06/09/15 1915 06/10/15 0515 06/12/15 0535 06/13/15 0555 06/14/15 0440  WBC 27.5* 21.2* 16.2* 14.1* 10.2     Recent Labs Lab 06/09/15 1915 06/09/15 2115 06/10/15 0515 06/11/15 0543 06/13/15 0555  CREATININE 0.72 0.55 0.36* 0.42* 0.48   Estimated Creatinine Clearance: 75.3 mL/min (by C-G formula based on Cr of 0.48).    No Known Allergies   Baldemar FridayMasters, Omaree Fuqua M PharmD 06/15/2015 11:05 AM

## 2015-06-15 NOTE — Progress Notes (Signed)
Pt requesting MD evaluate her finger. She states area under nail of middle finger of left hand looks the same as her previous finger infection. She continues to have on going complaints of severe pain in her foot requiring frequent narcotics.

## 2015-06-15 NOTE — Discharge Instructions (Addendum)
Bone and Joint Infections, Adult Bone infections (osteomyelitis) and joint infections (septic arthritis) occur when bacteria or other germs get inside a bone or a joint. This can happen if you have an infection in another part of your body that spreads through your blood. Germs from your skin or from outside of your body can also cause this type of infection if you have a wound or a broken bone (fracture) that breaks the skin. Anyone can get a bone infection or joint infection. You may be more likely to get this type of infection if you have a condition, such as diabetes, that lowers your ability to fight infection or increases your chances of getting an infection. Bone and joint infections can cause damage, and they can spread to other areas of your body. They need to be treated quickly. CAUSES Most bone and joint infections are caused by bacteria. They can also be caused by other germs, such as viruses and funguses. RISK FACTORS This condition is more likely to develop in:  People who recently had surgery, especially bone or joint surgery.  People who have a long-term (chronic) disease, such as:  HIV (human immunodeficiency virus).  Diabetes.  Rheumatoid arthritis.  Sickle cell anemia.  Elderly people.  People who take medicines that block or weaken the body's defense system (immune system).  People who have a condition that reduces their blood flow.  People who are on kidney dialysis.  People who have an artificial joint.  People who have had a joint or bone repaired with plates or screws (surgical hardware).  People who use or abuse IV drugs.  People who have had trauma, such as stepping on a nail. SYMPTOMS Symptoms vary depending on the type and location of your infection. Common symptoms of bone and joint infections include:  Fever and chills.  Redness and warmth.  Swelling.  Pain and stiffness.  Drainage of fluid or pus near the infection.  Weight loss and  fatigue.  Decreased ability to use a hand or foot. DIAGNOSIS This condition may be diagnosed based on symptoms, medical history, a physical exam, and diagnostic tests. Tests can help to identify the cause of the infection. You may have various tests, such as:  A sample of tissue, fluid, or blood taken to be examined under a microscope.  A procedure to remove fluid from the infected joint with a needle (joint aspiration) for testing in a lab.  Pus or discharge swabbed from a wound for testing to identify germs and to determine what type of medicine will kill them (culture and sensitivity).  Blood tests to look for evidence of infection and inflammation (biomarkers).  Imaging studies to determine how severe the bone or joint infection is. These may include:  X-rays.  CT scan.  MRI.  Bone scan. TREATMENT Treatment depends on the cause and type of infection. Antibiotic medicines are usually the first treatment for a bone or joint infection. Treatment with antibiotics may include:  Getting IV antibiotics. This may be done in a hospital at first. You may have to continue IV antibiotics at home for several weeks. You may also have to take antibiotics by mouth for several weeks after that.  Taking more than one kind of antibiotic. Treatment may start with a type of antibiotic that works against many different bacteria (broad spectrumantibiotics). IV antibiotics may be changed if tests show that another type may work better. Other treatments may include:  Draining fluid from the joint by placing a needle into  it (aspiration).  Surgery to remove:  Dead or dying tissue from a bone or joint.  An infected artificial joint.  Infected plates or screws that were used to repair a broken bone. HOME CARE INSTRUCTIONS  Take medicines only as directed by your health care provider.  Take your antibiotic medicine as directed by your health care provider. Finish the antibiotic even if you start  to feel better.  Follow instructions from your health care provider about how to take IV antibiotics at home.  Ask your health care provider if you have any restrictions on your activities.  Keep all follow-up visits as directed by your health care provider. This is important. SEEK MEDICAL CARE IF:  You have a fever or chills.  You have redness, warmth, pain, or swelling that returns after treatment. SEEK IMMEDIATE MEDICAL CARE IF:  You have rapid breathing or you have trouble breathing.  You have chest pain.  You cannot drink fluids or make urine.  The affected arm or leg swells, changes color, or turns blue.   This information is not intended to replace advice given to you by your health care provider. Make sure you discuss any questions you have with your health care provider.   Document Released: 05/16/2005 Document Revised: 09/30/2014 Document Reviewed: 05/14/2014 Elsevier Interactive Patient Education 2016 [employer]  Type 2 Diabetes Mellitus, Adult Type 2 diabetes mellitus, often simply referred to as type 2 diabetes, is a long-lasting (chronic) disease. In type 2 diabetes, the pancreas does not make enough insulin (a hormone), the cells are less responsive to the insulin that is made (insulin resistance), or both. Normally, insulin moves sugars from food into the tissue cells. The tissue cells use the sugars for energy. The lack of insulin or the lack of normal response to insulin causes excess sugars to build up in the blood instead of going into the tissue cells. As a result, high blood sugar (hyperglycemia) develops. The effect of high sugar (glucose) levels can cause many complications. Type 2 diabetes was also previously called adult-onset diabetes, but it can occur at any age.  RISK FACTORS  A person is predisposed to developing type 2 diabetes if someone in the family has the disease and also has one or more of the following primary risk factors:  Weight gain,  or being overweight or obese.  An inactive lifestyle.  A history of consistently eating high-calorie foods. Maintaining a normal weight and regular physical activity can reduce the chance of developing type 2 diabetes. SYMPTOMS  A person with type 2 diabetes may not show symptoms initially. The symptoms of type 2 diabetes appear slowly. The symptoms include:  Increased thirst (polydipsia).  Increased urination (polyuria).  Increased urination during the night (nocturia).  Sudden or unexplained weight changes.  Frequent, recurring infections.  Tiredness (fatigue).  Weakness.  Vision changes, such as blurred vision.  Fruity smell to your breath.  Abdominal pain.  Nausea or vomiting.  Cuts or bruises which are slow to heal.  Tingling or numbness in the hands or feet.  An open skin wound (ulcer). DIAGNOSIS Type 2 diabetes is frequently not diagnosed until complications of diabetes are present. Type 2 diabetes is diagnosed when symptoms or complications are present and when blood glucose levels are increased. Your blood glucose level may be checked by one or more of the following blood tests:  A fasting blood glucose test. You will not be allowed to eat for at least 8 hours before a blood sample is  taken.  A random blood glucose test. Your blood glucose is checked at any time of the day regardless of when you ate.  A hemoglobin A1c blood glucose test. A hemoglobin A1c test provides information about blood glucose control over the previous 3 months.  An oral glucose tolerance test (OGTT). Your blood glucose is measured after you have not eaten (fasted) for 2 hours and then after you drink a glucose-containing beverage. TREATMENT   You may need to take insulin or diabetes medicine daily to keep blood glucose levels in the desired range.  If you use insulin, you may need to adjust the dosage depending on the carbohydrates that you eat with each meal or snack.  Lifestyle  changes are recommended as part of your treatment. These may include:  Following an individualized diet plan developed by a nutritionist or dietitian.  Exercising daily. Your health care providers will set individualized treatment goals for you based on your age, your medicines, how long you have had diabetes, and any other medical conditions you have. Generally, the goal of treatment is to maintain the following blood glucose levels:  Before meals (preprandial): 80-130 mg/dL.  After meals (postprandial): below 180 mg/dL.  A1c: less than 6.5-7%. HOME CARE INSTRUCTIONS   Have your hemoglobin A1c level checked twice a year.  Perform daily blood glucose monitoring as directed by your health care provider.  Monitor urine ketones when you are ill and as directed by your health care provider.  Take your diabetes medicine or insulin as directed by your health care provider to maintain your blood glucose levels in the desired range.  Never run out of diabetes medicine or insulin. It is needed every day.  If you are using insulin, you may need to adjust the amount of insulin given based on your intake of carbohydrates. Carbohydrates can raise blood glucose levels but need to be included in your diet. Carbohydrates provide vitamins, minerals, and fiber which are an essential part of a healthy diet. Carbohydrates are found in fruits, vegetables, whole grains, dairy products, legumes, and foods containing added sugars.  Eat healthy foods. You should make an appointment to see a registered dietitian to help you create an eating plan that is right for you.  Lose weight if you are overweight.  Carry a medical alert card or wear your medical alert jewelry.  Carry a 15-gram carbohydrate snack with you at all times to treat low blood glucose (hypoglycemia). Some examples of 15-gram carbohydrate snacks include:  Glucose tablets, 3 or 4.  Glucose gel, 15-gram tube.  Raisins, 2 tablespoons (24  grams).  Jelly beans, 6.  Animal crackers, 8.  Regular pop, 4 ounces (120 mL).  Gummy treats, 9.  Recognize hypoglycemia. Hypoglycemia occurs with blood glucose levels of 70 mg/dL and below. The risk for hypoglycemia increases when fasting or skipping meals, during or after intense exercise, and during sleep. Hypoglycemia symptoms can include:  Tremors or shakes.  Decreased ability to concentrate.  Sweating.  Increased heart rate.  Headache.  Dry mouth.  Hunger.  Irritability.  Anxiety.  Restless sleep.  Altered speech or coordination.  Confusion.  Treat hypoglycemia promptly. If you are alert and able to safely swallow, follow the 15:15 rule:  Take 15-20 grams of rapid-acting glucose or carbohydrate. Rapid-acting options include glucose gel, glucose tablets, or 4 ounces (120 mL) of fruit juice, regular soda, or low-fat milk.  Check your blood glucose level 15 minutes after taking the glucose.  Take 15-20 grams more of  glucose if the repeat blood glucose level is still 70 mg/dL or below.  Eat a meal or snack within 1 hour once blood glucose levels return to normal.  Be alert to feeling very thirsty and urinating more frequently than usual, which are early signs of hyperglycemia. An early awareness of hyperglycemia allows for prompt treatment. Treat hyperglycemia as directed by your health care provider.  Engage in at least 150 minutes of moderate-intensity physical activity a week, spread over at least 3 days of the week or as directed by your health care provider. In addition, you should engage in resistance exercise at least 2 times a week or as directed by your health care provider. Try to spend no more than 90 minutes at one time inactive.  Adjust your medicine and food intake as needed if you start a new exercise or sport.  Follow your sick-day plan anytime you are unable to eat or drink as usual.  Do not use any tobacco products including cigarettes,  chewing tobacco, or electronic cigarettes. If you need help quitting, ask your health care provider.  Limit alcohol intake to no more than 1 drink per day for nonpregnant women and 2 drinks per day for men. You should drink alcohol only when you are also eating food. Talk with your health care provider whether alcohol is safe for you. Tell your health care provider if you drink alcohol several times a week.  Keep all follow-up visits as directed by your health care provider. This is important.  Schedule an eye exam soon after the diagnosis of type 2 diabetes and then annually.  Perform daily skin and foot care. Examine your skin and feet daily for cuts, bruises, redness, nail problems, bleeding, blisters, or sores. A foot exam by a health care provider should be done annually.  Brush your teeth and gums at least twice a day and floss at least once a day. Follow up with your dentist regularly.  Share your diabetes management plan with your workplace or school.  Keep your immunizations up to date. It is recommended that you receive a flu (influenza) vaccine every year. It is also recommended that you receive a pneumonia (pneumococcal) vaccine. If you are 72 years of age or older and have never received a pneumonia vaccine, this vaccine may be given as a series of two separate shots. Ask your health care provider which additional vaccines may be recommended.  Learn to manage stress.  Obtain ongoing diabetes education and support as needed.  Participate in or seek rehabilitation as needed to maintain or improve independence and quality of life. Request a physical or occupational therapy referral if you are having foot or hand numbness, or difficulties with grooming, dressing, eating, or physical activity. SEEK MEDICAL CARE IF:   You are unable to eat food or drink fluids for more than 6 hours.  You have nausea and vomiting for more than 6 hours.  Your blood glucose level is over 240  mg/dL.  There is a change in mental status.  You develop an additional serious illness.  You have diarrhea for more than 6 hours.  You have been sick or have had a fever for a couple of days and are not getting better.  You have pain during any physical activity.  SEEK IMMEDIATE MEDICAL CARE IF:  You have difficulty breathing.  You have moderate to large ketone levels.   This information is not intended to replace advice given to you by your health care provider.  Make sure you discuss any questions you have with your health care provider.   Document Released: 05/16/2005 Document Revised: 02/04/2015 Document Reviewed: 12/13/2011 Elsevier Interactive Patient Education 2016 [employer].    Pain Medicine Instructions  HOW CAN PAIN MEDICINE AFFECT ME?    You were given a prescription for pain medicine. This medicine may make you tired or drowsy and may affect your ability to think clearly. Pain medicine may also affect your ability to drive or perform certain physical activities. It may not be possible to make all of your pain go away, but you should be comfortable enough to move, breathe, and take care of yourself.  HOW OFTEN SHOULD I TAKE PAIN MEDICINE AND HOW MUCH SHOULD I TAKE?  Take pain medicine only as directed by your health care provider and only as needed for pain.  You do not need to take pain medicine if you are not having pain, unless directed by your health care provider.  You can take less than the prescribed dose if you find that a smaller amount of medicine controls your pain. WHAT RESTRICTIONS DO I HAVE WHILE TAKING PAIN MEDICINE?  Follow these instructions after you start taking pain medicine, while you are taking the medicine, and for 8 hours after you stop taking the medicine:  Do not drive.  Do not operate machinery.  Do not operate power tools.  Do not sign legal documents.  Do not drink alcohol.  Do not take sleeping pills.  Do not supervise children by  yourself.  Do not participate in activities that require climbing or being in high places.  Do not enter a body of water--such as a lake, river, ocean, spa, or swimming pool--without an adult nearby who can monitor and help you. HOW CAN I KEEP OTHERS SAFE WHILE I AM TAKING PAIN MEDICINE?  Store your pain medicine as directed by your health care provider. Make sure that it is placed where children and pets cannot reach it.  Never share your pain medicine with anyone.  Do not save any leftover pills. If you have any leftover pain medicine, get rid of it or destroy it as directed by your health care provider. WHAT ELSE DO I NEED TO KNOW ABOUT TAKING PAIN MEDICINE?  Use a stool softener if you become constipated from your pain medicine. Increasing your intake of fruits and vegetables will also help with constipation.  Write down the times when you take your pain medicine. Look at the times before you take your next dose of medicine. It is easy to become confused while on pain medicine. Recording the times helps you to avoid an overdose.  If your pain is severe, do not try to treat it yourself by taking more pills than instructed on your prescription. Contact your health care provider for help.  You may have been prescribed a pain medicine that contains acetaminophen. Do not take any other acetaminophen while taking this medicine. An overdose of acetaminophen can result in severe liver damage. Acetaminophen is found in many over-the-counter (OTC) and prescription medicines. If you are taking any medicines in addition to your pain medicine, check the active ingredients on those medicines to see if acetaminophen is listed. WHEN SHOULD I CALL MY HEALTH CARE PROVIDER?  Your medicine is not helping to make the pain go away.  You vomit or have diarrhea shortly after taking the medicine.  You develop new pain in areas that did not hurt before.  You have an allergic reaction to your  medicine. This may include:   Itchiness.  Swelling.  Dizziness.  Developing a new rash. WHEN SHOULD I CALL 911 OR GO TO THE EMERGENCY ROOM?  You feel dizzy or you faint.  You are very confused or disoriented.  You repeatedly vomit.  Your skin or lips turn pale or bluish in color.  You have shortness of breath or you are breathing much more slowly than usual.  You have a severe allergic reaction to your medicine. This includes:  Developing tongue swelling.  Having difficulty breathing. This information is not intended to replace advice given to you by your health care provider. Make sure you discuss any questions you have with your health care provider.  Document Released: 08/22/2000 Document Revised: 09/30/2014 Document Reviewed: 03/20/2014  Elsevier Interactive Patient Education [employer]

## 2015-06-15 NOTE — Progress Notes (Signed)
Physical Therapy Treatment Patient Details Name: Linda Poole MRN: [id number] DOB: Apr 08, 1966 Today's Date: 06/15/2015    History of Present Illness 50 y.o. 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 Hx of Diabetes mellitus     PT Comments    Continuing progress with gait and stair training; Noting good stability with hopping for amb in RW and good maintenance of NWB; We have continued practicing steps, and at this point I believe it will be safer for Linda Poole to sit down on the steps and bump up them, then have a chair at the top of the steps and someone to assist her to stand   Follow Up Recommendations  Outpatient PT;Supervision - Intermittent  The potential need for Outpatient PT can be addressed at Ortho follow-up appointments.  Recommend also an Orthotist consult for custom shoe once foot is healed.     Equipment Recommendations  Rolling walker with 5" wheels;3in1 (PT)    Recommendations for Other Services       Precautions / Restrictions Precautions Precautions: None Required Braces or Orthoses: Other Brace/Splint Other Brace/Splint: post op shoe Restrictions RLE Weight Bearing: Non weight bearing    Mobility  Bed Mobility Overal bed mobility: Independent                Transfers Overall transfer level: Needs assistance Equipment used: Rolling walker (2 wheeled) Transfers: Sit to/from Stand Sit to Stand: Supervision         General transfer comment: Continuing need for cues for hand placement; Linda Poole tends to impuslively stand by pulling up on RW  Ambulation/Gait Ambulation/Gait assistance: Supervision Ambulation Distance (Feet): 50 Feet Assistive device: Rolling walker (2 wheeled) Gait Pattern/deviations: Step-to pattern     General Gait Details: Noting quite good ability to keep NWB RLE with steps today   Stairs Stairs: Yes Stairs assistance: Min assist Stair Management: No rails;Backwards;Step to pattern;With walker Number of Stairs: 2  (x2) General stair comments: step-by-step cues for sequence, RW advancement, and hand placement; very nervous on steps and noted a tendency to touch RLE down; Ultimately I favor Linda Poole sitting down and bumping up the steps and having a chair at the top of the steps and her friend assist her to stand  Wheelchair Mobility    Modified Rankin (Stroke Patients Only)       Balance     Sitting balance-Leahy Scale: Good       Standing balance-Leahy Scale: Fair                      Cognition Arousal/Alertness: Awake/alert Behavior During Therapy: WFL for tasks assessed/performed;Impulsive Overall Cognitive Status: Within Functional Limits for tasks assessed                      Exercises      General Comments        Pertinent Vitals/Pain Pain Assessment: Faces Faces Pain Scale: Hurts even more Pain Location: Pain and pressure in L foot; Reports she did not sleep well last night because of pain Pain Descriptors / Indicators: Aching;Pressure Pain Intervention(s): Limited activity within patient's tolerance;Monitored during session;Repositioned (Elevated extremity)    Home Living                      Prior Function            PT Goals (current goals can now be found in the care plan section) Acute Rehab PT Goals Patient  Stated Goal: Go stay with my friend PT Goal Formulation: With patient Time For Goal Achievement: 06/27/15 Potential to Achieve Goals: Good Progress towards PT goals: Progressing toward goals    Frequency  Min 3X/week    PT Plan Current plan remains appropriate    Co-evaluation             End of Session Equipment Utilized During Treatment: Gait belt Activity Tolerance: Patient tolerated treatment well Patient left: in chair;with call bell/phone within reach     Time: 0945-1005 PT Time Calculation (min) (ACUTE ONLY): 20 min  Charges:  $Gait Training: 8-22 mins                    G Codes:      Olen PelGarrigan,  Aamirah Salmi Hamff 06/15/2015, 10:15 AM  Van ClinesHolly Angelice Piech, PT  Acute Rehabilitation Services Pager [phone] Office [phone]

## 2015-06-15 NOTE — Discharge Summary (Addendum)
Physician Discharge Summary  Linda RocheJulie Poole QMV:[id number] DOB: 05/11/1966 DOA: 06/09/2015  PCP: No PCP Per Patient  Admit date: 06/09/2015 Discharge date: 06/15/2015  Time spent: Greater than 30 minutes  Recommendations for Outpatient Follow-up:  1. [facility]/PCP on 07/03/15 at 2:15 PM. To be seen with repeat labs (CBC & BMP). 2. Dr. Aldean BakerMarcus Duda, Orthopedics in 1 week. Please follow-up final operative culture results sent from the hospital. 3. DME: 3 n 1, rolling walker. Requested case management to arrange outpatient PT as per physical therapy recommendations.  Discharge Diagnoses:  Principal Problem:   Sepsis (HCC) Active Problems:   Diabetic foot infection (HCC)   Hyperosmolar non-ketotic state in patient with type 2 diabetes mellitus (HCC)   Discharge Condition: Improved & Stable  Diet recommendation: Heart healthy and diabetic diet.  Filed Weights   06/12/15 0500 06/13/15 0530 06/15/15 0400  Weight: 66.7 kg (147 lb 0.8 oz) 64.2 kg (141 lb 8.6 oz) 63.504 kg (140 lb)    History of present illness:  50 year old female patient with history of DM 2, noncompliant with medications, tobacco abuse, presented with painful right second toe. In the ED, tachycardic, febrile, blood glucose in the 600 range without increased anion gap. MRI foot confirmed right second toe osteomyelitis. Orthopedics consulted s/p R foot 2nd ray amputation 1/13.   Hospital Course:   Sepsis, present on admission, secondary to right second toe osteomyelitis/cellulitis - Treated empirically with IV vancomycin and Zosyn and has completed 6 days of same. - Orthopedics consulted & s/p R foot 2nd ray amputation 1/13.  - Discussed with Dr. Lajoyce Cornersuda: Cleared for discharge home, outpatient follow-up with him in 1 week, keep same dressing until she sees him in the office for dressing change and advised not to soil or get it wet. - Arranged DME and outpatient PT as per therapy recommendations. - Wound culture  showed strep agalactia-discussed with infectious disease M.D. on call who recommended a course of amoxicillin to complete 1 week treatment.  Left second finger paronychia - No acute findings on clinical exam or x-ray.  Uncontrolled type II DM with hyperosmolar state - Secondary to noncompliance with medications. - Briefly placed on insulin drip and then transitioned to Lantus and SSI.  - A1c: 15.8. Adjusted Lantus, SSI and add mealtime NovoLog.  - CBGs fluctuating but improved control compared to preoperatively. -Diabetes coordinator consultation appreciated. Patient was educated regarding all aspects of diabetes management including insulin self administration. Patient will be discharged on Levemir, NovoLog flex pens and increased dose of metformin. Case management provided patient with a match letter for medication needs. Diabetes coordinator also advised patient that [facility] - Outpatient follow-up and will need close monitoring and management.   Tobacco abuse - Cessation counseled.  Hypokalemia - Replaced.  Anemia - Stable.  Hyponatremia - Secondary to dehydration and hyperglycemia. Improved. Resolved.  Chronic Methadone Rx  - High threshold for pain. Patient was provided short course of OxyIR for acute/breakthrough pain. - Has been on methadone due to history of prescription medication abuse.     Consultants:  Orthopedic/Dr. Lajoyce Cornersuda  Procedures:  Status post right foot second ray amputation and local tissue rearrangement for wound closure on 06/12/15    Discharge Exam:  Complaints:  Decreasing right foot surgical site pain. Seen ambulating in the room comfortably. No other complaints reported.  Filed Vitals:   06/14/15 2115 06/15/15 0400 06/15/15 0439 06/15/15 1335  BP: 124/93  132/88 120/77  Pulse: 101  97 104  Temp: 98.4 F (36.9 C)  99.1 F (37.3 C) 98.8 F (37.1 C)  TempSrc: Oral  Oral  Oral  Resp: 18  18 18   Weight:  63.504 kg (140 lb)    SpO2: 98%  96% 98%    General exam: Pleasant middle-aged female seen comfortably ambulating in the room.  Respiratory system: Clear. No increased work of breathing. Cardiovascular system: S1 & S2 heard, RRR. No JVD, murmurs, gallops, clicks or pedal edema. Gastrointestinal system: Abdomen is nondistended, soft and nontender. Normal bowel sounds heard. Central nervous system: Alert and oriented. No focal neurological deficits. Extremities: Symmetric 5 x 5 power. Right foot postop dressing clean and dry.   Discharge Instructions      Discharge Instructions    Call MD for:  difficulty breathing, headache or visual disturbances    Complete by:  As directed      Call MD for:  extreme fatigue    Complete by:  As directed      Call MD for:  persistant dizziness or light-headedness    Complete by:  As directed      Call MD for:  persistant nausea and vomiting    Complete by:  As directed      Call MD for:  redness, tenderness, or signs of infection (pain, swelling, redness, odor or green/yellow discharge around incision site)    Complete by:  As directed      Call MD for:  severe uncontrolled pain    Complete by:  As directed      Call MD for:  temperature >100.4    Complete by:  As directed      Diet - low sodium heart healthy    Complete by:  As directed      Diet Carb Modified    Complete by:  As directed      Increase activity slowly    Complete by:  As directed             Medication List    STOP taking these medications        ibuprofen 200 MG tablet  Commonly known as:  ADVIL,MOTRIN      TAKE these medications        amoxicillin 500 MG tablet  Commonly known as:  AMOXIL  Take 1 tablet (500 mg total) by mouth 3 (three) times daily.     insulin aspart 100 UNIT/ML FlexPen  Commonly known as:  NOVOLOG FLEXPEN  Inject 0-15 Units into the skin 3 (three) times daily with meals. CBG < 70: Eat or drink something  and recheck, CBG 70 - 120: 0 units CBG 121 - 150: 2 units CBG 151 - 200: 3 units CBG 201 - 250: 5 units CBG 251 - 300: 8 units CBG 301 - 350: 11 units CBG 351 - 400: 15 units CBG > 400: call MD.     Insulin Detemir 100 UNIT/ML Pen  Commonly known as:  LEVEMIR  Inject 20 Units into the skin daily at 10 pm.     Insulin Pen Needle 31G X 5 MM Misc  Use as directed, 3 times a day with meals and at bedtime.     metFORMIN 500 MG tablet  Commonly known as:  GLUCOPHAGE  Take 2 tablets (1,000 mg total) by mouth 2 (two) times daily with a meal.     methadone 10 MG/5ML solution  Commonly known as:  DOLOPHINE  Take 120  mg by mouth every morning.     oxyCODONE 5 MG immediate release tablet  Commonly known as:  Oxy IR/ROXICODONE  Take 1-2 tablets (5-10 mg total) by mouth every 4 (four) hours as needed for moderate pain, severe pain or breakthrough pain.       Follow-up Information    Follow up with DUDA,MARCUS V, MD. Schedule an appointment as soon as possible for a visit in 1 week.   Specialty:  Orthopedic Surgery   Contact information:   [address] [place] [address] [phone]       Follow up with [facility] On 07/03/2015.   Why:  Appointment on Friday 07/03/15 at 2:15pm. Telephone [phone]. To be seen with repeat labs (CBC & BMP).   Contact information:   [address] [place] [address]        The results of significant diagnostics from this hospitalization (including imaging, microbiology, ancillary and laboratory) are listed below for reference.    Significant Diagnostic Studies: Dg Hand 2 View Left  06/10/2015  CLINICAL DATA:  Follow-up left index finger cellulitis, history of diabetes EXAM: LEFT HAND - 2 VIEW COMPARISON:  None. FINDINGS: Two views of the left hand submitted. No acute fracture or subluxation. No periosteal reaction or bony erosion. Mild soft tissue swelling noted distal aspect of the second finger.  IMPRESSION: No acute fracture or subluxation. Mild soft tissue swelling second finger. No evidence of osteomyelitis. Electronically Signed   By: Natasha Mead M.D.   On: 06/10/2015 09:16   Mr Foot Right Wo Contrast  06/10/2015  CLINICAL DATA:  Pain and cellulitis of the second toe of the right foot. EXAM: MRI OF THE RIGHT FOREFOOT WITHOUT CONTRAST TECHNIQUE: Multiplanar, multisequence MR imaging was performed. No intravenous contrast was administered. COMPARISON:  Radiographs dated 06/09/2015 FINDINGS: There is destruction of the tuft of the distal phalanx of the second toe with abnormal signal throughout the distal phalangeal bone of the second toe with a soft tissue edema and swelling of the toe. Bandages in place. No joint effusions.  The other bones of the forefoot are normal. IMPRESSION: Osteomyelitis of the distal phalanx of the second toe with adjacent cellulitis. Electronically Signed   By: Francene Boyers M.D.   On: 06/10/2015 09:32   Dg Foot Complete Right  06/09/2015  CLINICAL DATA:  Injury to right foot 2 days ago with swelling, erythema and pain. Initial encounter. EXAM: RIGHT FOOT COMPLETE - 3+ VIEW COMPARISON:  None. FINDINGS: There is no evidence of fracture or dislocation. There is no evidence of arthropathy or other focal bone abnormality. Soft tissues are unremarkable. IMPRESSION: Negative. Electronically Signed   By: Irish Lack M.D.   On: 06/09/2015 19:49    Microbiology: Recent Results (from the past 240 hour(s))  Culture, blood (Routine X 2) w Reflex to ID Panel     Status: None   Collection Time: 06/09/15  9:00 PM  Result Value Ref Range Status   Specimen Description BLOOD LEFT FOREARM  Final   Special Requests BOTTLES DRAWN AEROBIC ONLY 10CC  Final   Culture NO GROWTH 5 DAYS  Final   Report Status 06/15/2015 FINAL  Final  Culture, blood (Routine X 2) w Reflex to ID Panel     Status: None   Collection Time: 06/09/15  9:14 PM  Result Value Ref Range Status   Specimen  Description BLOOD LEFT ANTECUBITAL  Final   Special Requests BOTTLES DRAWN AEROBIC AND ANAEROBIC  5CC  Final   Culture NO GROWTH 5 DAYS  Final   Report Status 06/15/2015 FINAL  Final  Surgical pcr screen     Status: None   Collection Time: 06/12/15  6:04 AM  Result Value Ref Range Status   MRSA, PCR NEGATIVE NEGATIVE Final   Staphylococcus aureus NEGATIVE NEGATIVE Final    Comment:        The Xpert SA Assay (FDA approved for NASAL specimens in patients over 55 years of age), is one component of a comprehensive surveillance program.  Test performance has been validated by [facility] for patients greater than or equal to 42 year old. It is not intended to diagnose infection nor to guide or monitor treatment.   Anaerobic culture     Status: None (Preliminary result)   Collection Time: 06/12/15  5:21 PM  Result Value Ref Range Status   Specimen Description ABSCESS RIGHT FOOT  Final   Special Requests PATIENT ON FOLLOWING VANC AND ZOSYN  Final   Gram Stain PENDING  Incomplete   Culture   Final    NO ANAEROBES ISOLATED; CULTURE IN PROGRESS FOR 5 DAYS Performed at [facility]    Report Status PENDING  Incomplete  Fungus Culture with Smear     Status: None (Preliminary result)   Collection Time: 06/12/15  5:21 PM  Result Value Ref Range Status   Specimen Description ABSCESS RIGHT FOOT  Final   Special Requests PATIENT ON FOLLOWING VANC AND ZOSYN  Final   Fungal Smear   Final    NO YEAST OR FUNGAL ELEMENTS SEEN Performed at [facility]    Culture   Final    CULTURE IN PROGRESS FOR FOUR WEEKS Performed at [facility]    Report Status PENDING  Incomplete  AFB culture with smear     Status: None (Preliminary result)   Collection Time: 06/12/15  5:21 PM  Result Value Ref Range Status   Specimen Description ABSCESS RIGHT FOOT  Final   Special Requests PATIENT ON FOLLOWING VANC AND ZOSYN  Final   Acid Fast Smear   Final    NO ACID FAST BACILLI  SEEN Performed at [facility]    Culture   Final    CULTURE WILL BE EXAMINED FOR 6 WEEKS BEFORE ISSUING A FINAL REPORT Performed at [facility]    Report Status PENDING  Incomplete  Culture, routine-abscess     Status: None   Collection Time: 06/12/15  5:21 PM  Result Value Ref Range Status   Specimen Description ABSCESS RIGHT FOOT  Final   Special Requests PATIENT ON FOLLOWING VANC AND ZOSYN  Final   Gram Stain   Final    FEW WBC PRESENT,BOTH PMN AND MONONUCLEAR NO SQUAMOUS EPITHELIAL CELLS SEEN NO ORGANISMS SEEN Performed at [facility]    Culture   Final    FEW GROUP B STREP(S.AGALACTIAE)ISOLATED Note: Beta hemolytic streptococci are predictably susceptible to penicillin and other beta lactams. Susceptibility testing not routinely performed. Performed at [facility]    Report Status 06/15/2015 FINAL  Final     Labs: Basic Metabolic Panel:  Recent Labs Lab 06/09/15 1915 06/09/15 2115 06/10/15 0515 06/11/15 0543 06/13/15 0555  NA 127* 130* 135 139 136  K 3.5 3.3* 3.1* 3.9 3.5  CL 84* 87* 102 107 97*  CO2 27 27 26 28 27   GLUCOSE 662* 527* 302* 271* 230*  BUN <5* <5* <5* 5* 6  CREATININE 0.72 0.55  0.36* 0.42* 0.48  CALCIUM 10.1 9.8 7.9* 8.5* 8.9  MG  --   --   --  2.1  --    Liver Function Tests:  Recent Labs Lab 06/09/15 1915 06/10/15 0515  AST 15 17  ALT 15 12*  ALKPHOS 167* 117  BILITOT 0.5 0.4  PROT 8.2* 5.6*  ALBUMIN 3.6 2.3*   No results for input(s): LIPASE, AMYLASE in the last 168 hours. No results for input(s): AMMONIA in the last 168 hours. CBC:  Recent Labs Lab 06/09/15 1915 06/10/15 0515 06/12/15 0535 06/13/15 0555 06/14/15 0440  WBC 27.5* 21.2* 16.2* 14.1* 10.2  NEUTROABS 22.2* 15.9*  --   --   --   HGB 13.9 10.4* 11.2* 10.9* 11.5*  HCT 40.8 31.5* 34.2* 34.0* 35.3*  MCV 89.5 89.0 91.0 91.6 91.5  PLT 339 298 308 360 366   Cardiac Enzymes: No results for input(s): CKTOTAL, CKMB, CKMBINDEX,  TROPONINI in the last 168 hours. BNP: BNP (last 3 results) No results for input(s): BNP in the last 8760 hours.  ProBNP (last 3 results) No results for input(s): PROBNP in the last 8760 hours.  CBG:  Recent Labs Lab 06/14/15 1125 06/14/15 1621 06/14/15 2121 06/15/15 0630 06/15/15 1145  GLUCAP 186* 157* 195* 286* 134*       Signed:  Marcellus Scott, MD, FACP, FHM. [facility] Pager [phone]  If 7PM-7AM, please contact night-coverage [email or web address] Password [facility] 06/15/2015, 3:53 PM

## 2015-06-17 ENCOUNTER — Encounter: Payer: Self-pay | Admitting: Family Medicine

## 2015-06-17 ENCOUNTER — Ambulatory Visit (INDEPENDENT_AMBULATORY_CARE_PROVIDER_SITE_OTHER): Payer: Self-pay | Admitting: Family Medicine

## 2015-06-17 VITALS — BP 136/92 | HR 98 | Temp 98.0°F | Resp 14 | Ht 62.0 in | Wt 134.0 lb

## 2015-06-17 DIAGNOSIS — E119 Type 2 diabetes mellitus without complications: Secondary | ICD-10-CM | POA: Insufficient documentation

## 2015-06-17 DIAGNOSIS — E118 Type 2 diabetes mellitus with unspecified complications: Secondary | ICD-10-CM

## 2015-06-17 DIAGNOSIS — Z794 Long term (current) use of insulin: Secondary | ICD-10-CM

## 2015-06-17 DIAGNOSIS — Z87898 Personal history of other specified conditions: Secondary | ICD-10-CM

## 2015-06-17 DIAGNOSIS — F17200 Nicotine dependence, unspecified, uncomplicated: Secondary | ICD-10-CM

## 2015-06-17 DIAGNOSIS — Z89421 Acquired absence of other right toe(s): Secondary | ICD-10-CM

## 2015-06-17 LAB — POCT URINALYSIS DIP (DEVICE)
Bilirubin Urine: NEGATIVE
Glucose, UA: >=1000 mg/dL — AB
Ketones, ur: NEGATIVE mg/dL
Leukocytes, UA: NEGATIVE
NITRITE: NEGATIVE
PH: 5.5 (ref 5.0–8.0)
PROTEIN: NEGATIVE mg/dL
SPECIFIC GRAVITY, URINE: 1.015 (ref 1.005–1.030)
UROBILINOGEN UA: 0.2 mg/dL (ref 0.0–1.0)

## 2015-06-17 LAB — ANAEROBIC CULTURE

## 2015-06-17 LAB — GLUCOSE, CAPILLARY: Glucose-Capillary: 327 mg/dL — ABNORMAL HIGH (ref 65–99)

## 2015-06-17 MED ORDER — GLUCOSE BLOOD VI STRP: ORAL_STRIP | Status: AC

## 2015-06-17 MED ORDER — TRUE METRIX AIR GLUCOSE METER DEVI: 1.0000 | Freq: Four times a day (QID) | Status: AC

## 2015-06-17 MED ORDER — NICOTINE 14 MG/24HR TD PT24: 14.0000 mg | MEDICATED_PATCH | Freq: Every day | TRANSDERMAL | Status: AC

## 2015-06-17 MED ORDER — INSULIN SYRINGES (DISPOSABLE) U-100 0.3 ML MISC: 1.0000 | Freq: Four times a day (QID) | Status: AC

## 2015-06-17 MED ORDER — INSULIN ASPART 100 UNIT/ML ~~LOC~~ SOLN: 0.0000 [IU] | Freq: Three times a day (TID) | SUBCUTANEOUS | Status: AC

## 2015-06-17 MED FILL — TRUE METRIX TEST STRIP: 25 days supply | Qty: 100 | Fill #0

## 2015-06-17 MED FILL — !NOVOLOG 100UNITS/ML VIAL: 100/ML | 28 days supply | Qty: 10 | Fill #0

## 2015-06-17 MED FILL — !TRUE METRIX BLOOD GLUCOSE: 365 days supply | Qty: 1 | Fill #0

## 2015-06-17 NOTE — Patient Instructions (Addendum)
Please call orthopedic surgeon to schedule follow up as soon as possible. Take all antibiotics.  Bring glucometer to follow up appoinment Use Novolog per sliding scale:  CBG 70-120; 0 units 121-150; 2 units 151-200; 3 units 201-250; 5 units 251-300; 8 units 301-350; 11 units 351-400; 15 units CBG >400; Call provider   Basic Carbohydrate Counting for Diabetes Mellitus Carbohydrate counting is a method for keeping track of the amount of carbohydrates you eat. Eating carbohydrates naturally increases the level of sugar (glucose) in your blood, so it is important for you to know the amount that is okay for you to have in every meal. Carbohydrate counting helps keep the level of glucose in your blood within normal limits. The amount of carbohydrates allowed is different for every person. A dietitian can help you calculate the amount that is right for you. Once you know the amount of carbohydrates you can have, you can count the carbohydrates in the foods you want to eat. Carbohydrates are found in the following foods:  Grains, such as breads and cereals.  Dried beans and soy products.  Starchy vegetables, such as potatoes, peas, and corn.  Fruit and fruit juices.  Milk and yogurt.  Sweets and snack foods, such as cake, cookies, candy, chips, soft drinks, and fruit drinks. CARBOHYDRATE COUNTING There are two ways to count the carbohydrates in your food. You can use either of the methods or a combination of both. Reading the "Nutrition Facts" on Packaged Food The "Nutrition Facts" is an area that is included on the labels of almost all packaged food and beverages in the [place]. It includes the serving size of that food or beverage and information about the nutrients in each serving of the food, including the grams (g) of carbohydrate per serving.  Decide the number of servings of this food or beverage that you will be able to eat or drink. Multiply that number of servings by the  number of grams of carbohydrate that is listed on the label for that serving. The total will be the amount of carbohydrates you will be having when you eat or drink this food or beverage. Learning Standard Serving Sizes of Food When you eat food that is not packaged or does not include "Nutrition Facts" on the label, you need to measure the servings in order to count the amount of carbohydrates.A serving of most carbohydrate-rich foods contains about 15 g of carbohydrates. The following list includes serving sizes of carbohydrate-rich foods that provide 15 g ofcarbohydrate per serving:   1 slice of bread (1 oz) or 1 six-inch tortilla.    of a hamburger bun or English muffin.  4-6 crackers.   cup unsweetened dry cereal.    cup hot cereal.   cup rice or pasta.    cup mashed potatoes or  of a large baked potato.  1 cup fresh fruit or one small piece of fruit.    cup canned or frozen fruit or fruit juice.  1 cup milk.   cup plain fat-free yogurt or yogurt sweetened with artificial sweeteners.   cup cooked dried beans or starchy vegetable, such as peas, corn, or potatoes.  Decide the number of standard-size servings that you will eat. Multiply that number of servings by 15 (the grams of carbohydrates in that serving). For example, if you eat 2 cups of strawberries, you will have eaten 2 servings and 30 g of carbohydrates (2 servings x 15 g = 30 g). For foods such as soups  and casseroles, in which more than one food is mixed in, you will need to count the carbohydrates in each food that is included. EXAMPLE OF CARBOHYDRATE COUNTING Sample Dinner  3 oz chicken breast.   cup of brown rice.   cup of corn.  1 cup milk.   1 cup strawberries with sugar-free whipped topping.  Carbohydrate Calculation Step 1: Identify the foods that contain carbohydrates:   Rice.   Corn.   Milk.   Strawberries. Step 2:Calculate the number of servings eaten of each:   2  servings of rice.   1 serving of corn.   1 serving of milk.   1 serving of strawberries. Step 3: Multiply each of those number of servings by 15 g:   2 servings of rice x 15 g = 30 g.   1 serving of corn x 15 g = 15 g.   1 serving of milk x 15 g = 15 g.   1 serving of strawberries x 15 g = 15 g. Step 4: Add together all of the amounts to find the total grams of carbohydrates eaten: 30 g + 15 g + 15 g + 15 g = 75 g.   This information is not intended to replace advice given to you by your health care provider. Make sure you discuss any questions you have with your health care provider.   Document Released: 05/16/2005 Document Revised: 06/06/2014 Document Reviewed: 04/12/2013 Elsevier Interactive Patient Education 2016 [employer] Blood Glucose Monitoring, Adult Monitoring your blood glucose (also know as blood sugar) helps you to manage your diabetes. It also helps you and your health care provider monitor your diabetes and determine how well your treatment plan is working. WHY SHOULD YOU MONITOR YOUR BLOOD GLUCOSE?  It can help you understand how food, exercise, and medicine affect your blood glucose.  It allows you to know what your blood glucose is at any given moment. You can quickly tell if you are having low blood glucose (hypoglycemia) or high blood glucose (hyperglycemia).  It can help you and your health care provider know how to adjust your medicines.  It can help you understand how to manage an illness or adjust medicine for exercise. WHEN SHOULD YOU TEST? Your health care provider will help you decide how often you should check your blood glucose. This may depend on the type of diabetes you have, your diabetes control, or the types of medicines you are taking. Be sure to write down all of your blood glucose readings so that this information can be reviewed with your health care provider. See below for examples of testing times that your health care provider may  suggest. Type 1 Diabetes  Test at least 2 times per day if your diabetes is well controlled, if you are using an insulin pump, or if you perform multiple daily injections.  If your diabetes is not well controlled or if you are sick, you may need to test more often.  It is a good idea to also test:  Before every insulin injection.  Before and after exercise.  Between meals and 2 hours after a meal.  Occasionally between 2:00 a.m. and 3:00 a.m. Type 2 Diabetes  If you are taking insulin, test at least 2 times per day. However, it is best to test before every insulin injection.  If you take medicines by mouth (orally), test 2 times a day.  If you are on a controlled diet, test once a day.  If  your diabetes is not well controlled or if you are sick, you may need to monitor more often. HOW TO MONITOR YOUR BLOOD GLUCOSE Supplies Needed  Blood glucose meter.  Test strips for your meter. Each meter has its own strips. You must use the strips that go with your own meter.  A pricking needle (lancet).  A device that holds the lancet (lancing device).  A journal or log book to write down your results. Procedure  Wash your hands with soap and water. Alcohol is not preferred.  Prick the side of your finger (not the tip) with the lancet.  Gently milk the finger until a small drop of blood appears.  Follow the instructions that come with your meter for inserting the test strip, applying blood to the strip, and using your blood glucose meter. Other Areas to Get Blood for Testing Some meters allow you to use other areas of your body (other than your finger) to test your blood. These areas are called alternative sites. The most common alternative sites are:  The forearm.  The thigh.  The back area of the lower leg.  The palm of the hand. The blood flow in these areas is slower. Therefore, the blood glucose values you get may be delayed, and the numbers are different from what you  would get from your fingers. Do not use alternative sites if you think you are having hypoglycemia. Your reading will not be accurate. Always use a finger if you are having hypoglycemia. Also, if you cannot feel your lows (hypoglycemia unawareness), always use your fingers for your blood glucose checks. ADDITIONAL TIPS FOR GLUCOSE MONITORING  Do not reuse lancets.  Always carry your supplies with you.  All blood glucose meters have a 24-hour "hotline" number to call if you have questions or need help.  Adjust (calibrate) your blood glucose meter with a control solution after finishing a few boxes of strips. BLOOD GLUCOSE RECORD KEEPING It is a good idea to keep a daily record or log of your blood glucose readings. Most glucose meters, if not all, keep your glucose records stored in the meter. Some meters come with the ability to download your records to your home computer. Keeping a record of your blood glucose readings is especially helpful if you are wanting to look for patterns. Make notes to go along with the blood glucose readings because you might forget what happened at that exact time. Keeping good records helps you and your health care provider to work together to achieve good diabetes management.    This information is not intended to replace advice given to you by your health care provider. Make sure you discuss any questions you have with your health care provider.   Document Released: 05/19/2003 Document Revised: 06/06/2014 Document Reviewed: 10/08/2012 Elsevier Interactive Patient Education 2016 [employer]. Diabetes Mellitus and Food It is important for you to manage your blood sugar (glucose) level. Your blood glucose level can be greatly affected by what you eat. Eating healthier foods in the appropriate amounts throughout the day at about the same time each day will help you control your blood glucose level. It can also help slow or prevent worsening of your diabetes mellitus.  Healthy eating may even help you improve the level of your blood pressure and reach or maintain a healthy weight.  General recommendations for healthful eating and cooking habits include:  Eating meals and snacks regularly. Avoid going long periods of time without eating to lose weight.  Eating a diet that consists mainly of plant-based foods, such as fruits, vegetables, nuts, legumes, and whole grains.  Using low-heat cooking methods, such as baking, instead of high-heat cooking methods, such as deep frying. Work with your dietitian to make sure you understand how to use the Nutrition Facts information on food labels. HOW CAN FOOD AFFECT ME? Carbohydrates Carbohydrates affect your blood glucose level more than any other type of food. Your dietitian will help you determine how many carbohydrates to eat at each meal and teach you how to count carbohydrates. Counting carbohydrates is important to keep your blood glucose at a healthy level, especially if you are using insulin or taking certain medicines for diabetes mellitus. Alcohol Alcohol can cause sudden decreases in blood glucose (hypoglycemia), especially if you use insulin or take certain medicines for diabetes mellitus. Hypoglycemia can be a life-threatening condition. Symptoms of hypoglycemia (sleepiness, dizziness, and disorientation) are similar to symptoms of having too much alcohol.  If your health care provider has given you approval to drink alcohol, do so in moderation and use the following guidelines:  Women should not have more than one drink per day, and men should not have more than two drinks per day. One drink is equal to:  12 oz of beer.  5 oz of wine.  1 oz of hard liquor.  Do not drink on an empty stomach.  Keep yourself hydrated. Have water, diet soda, or unsweetened iced tea.  Regular soda, juice, and other mixers might contain a lot of carbohydrates and should be counted. WHAT FOODS ARE NOT RECOMMENDED? As you  make food choices, it is important to remember that all foods are not the same. Some foods have fewer nutrients per serving than other foods, even though they might have the same number of calories or carbohydrates. It is difficult to get your body what it needs when you eat foods with fewer nutrients. Examples of foods that you should avoid that are high in calories and carbohydrates but low in nutrients include:  Trans fats (most processed foods list trans fats on the Nutrition Facts label).  Regular soda.  Juice.  Candy.  Sweets, such as cake, pie, doughnuts, and cookies.  Fried foods. WHAT FOODS CAN I EAT? Eat nutrient-rich foods, which will nourish your body and keep you healthy. The food you should eat also will depend on several factors, including:  The calories you need.  The medicines you take.  Your weight.  Your blood glucose level.  Your blood pressure level.  Your cholesterol level. You should eat a variety of foods, including:  Protein.  Lean cuts of meat.  Proteins low in saturated fats, such as fish, egg whites, and beans. Avoid processed meats.  Fruits and vegetables.  Fruits and vegetables that may help control blood glucose levels, such as apples, mangoes, and yams.  Dairy products.  Choose fat-free or low-fat dairy products, such as milk, yogurt, and cheese.  Grains, bread, pasta, and rice.  Choose whole grain products, such as multigrain bread, whole oats, and brown rice. These foods may help control blood pressure.  Fats.  Foods containing healthful fats, such as nuts, avocado, olive oil, canola oil, and fish. DOES EVERYONE WITH DIABETES MELLITUS HAVE THE SAME MEAL PLAN? Because every person with diabetes mellitus is different, there is not one meal plan that works for everyone. It is very important that you meet with a dietitian who will help you create a meal plan that is just right for you.  This information is not intended to replace  advice given to you by your health care provider. Make sure you discuss any questions you have with your health care provider.   Document Released: 02/10/2005 Document Revised: 06/06/2014 Document Reviewed: 04/12/2013 Elsevier Interactive Patient Education 2016 [employer]. How to Avoid Diabetes Problems You can do a lot to prevent or slow down diabetes problems. Following your diabetes plan and taking care of yourself can reduce your risk of serious or life-threatening complications. Below, you will find certain things you can do to prevent diabetes problems. MANAGE YOUR DIABETES Follow your health care provider's, nurse educator's, and dietitian's instructions for managing your diabetes. They will teach you the basics of diabetes care. They can help answer questions you may have. Learn about diabetes and make healthy choices regarding eating and physical activity. Monitor your blood glucose level regularly. Your health care provider will help you decide how often to check your blood glucose level depending on your treatment goals and how well you are meeting them.  DO NOT USE NICOTINE Nicotine and diabetes are a dangerous combination. Nicotine raises your risk for diabetes problems. If you quit using nicotine, you will lower your risk for heart attack, stroke, nerve disease, and kidney disease. Your cholesterol and your blood pressure levels may improve. Your blood circulation will also improve. Do not use any tobacco products, including cigarettes, chewing tobacco, or electronic cigarettes. If you need help quitting, ask your health care provider. KEEP YOUR BLOOD PRESSURE UNDER CONTROL Your health care provider will determine your individualized target blood pressure based on your age, your medicines, how long you have had diabetes, and any other medical conditions you have. Blood pressure consists of two numbers. Generally, the goal is to keep your top number (systolic pressure) at or below 130, and  your bottom number (diastolic pressure) at or below 80. Your health care provider may recommend a lower target blood pressure reading, if appropriate. Meal planning, medicines, and exercise can help you reach your target blood pressure. Make sure your health care provider checks your blood pressure at every visit. KEEP YOUR CHOLESTEROL UNDER CONTROL Normal cholesterol levels will help prevent heart disease and stroke. These are the biggest health problems for people with diabetes. Keeping cholesterol levels under control can also help with blood flow. Have your cholesterol level checked at least once a year. Your health care provider may prescribe a medicine known as a statin. Statins lower your cholesterol. If you are not taking a statin, ask your health care provider if you should be. Meal planning, exercise, and medicines can help you reach your cholesterol targets.  SCHEDULE AND KEEP YOUR ANNUAL PHYSICAL EXAMS AND EYE EXAMS Your health care provider will tell you how often he or she wants to see you depending on your plan of treatment. It is important that you keep these appointments so that possible problems can be identified early and complications can be avoided or treated.  Every visit with your health care provider should include your weight, blood pressure, and an evaluation of your blood glucose control.  Your hemoglobin A1c should be checked:  At least twice a year if you are at your goal.  Every 3 months if there are changes in treatment.  If you are not meeting your goals.  Your blood lipids should be checked yearly. You should also be checked yearly to see if you have protein in your urine (microalbumin).  Schedule a dilated eye exam within 5 years of your  diagnosis if you have type 1 diabetes, and then yearly. Schedule a dilated eye exam at diagnosis if you have type 2 diabetes, and then yearly. All exams thereafter can be extended to every 2 to 3 years if one or more exams have  been normal. KEEP YOUR VACCINES CURRENT It is recommended that you receive a flu (influenza) vaccine every year. It is also recommended that you receive a pneumonia (pneumococcal) vaccine. If you are 59 years of age or older and have never received a pneumonia vaccine, this vaccine may be given as a series of two separate shots. Ask your health care provider which additional vaccines may be recommended. TAKE CARE OF YOUR FEET  Diabetes may cause you to have a poor blood supply (circulation) to your legs and feet. Because of this, the skin may be thinner, break easier, and heal more slowly. You also may have nerve damage in your legs and feet, causing decreased feeling. You may not notice minor injuries to your feet that could lead to serious problems or infections. Taking care of your feet is very important. Visual foot exams are performed at every routine medical visit. The exams check for cuts, injuries, or other problems with the feet. A comprehensive foot exam should be done yearly. This includes visual inspection as well as assessing foot pulses and testing for loss of sensation. You should also do the following:  Inspect your feet daily for cuts, calluses, blisters, ingrown toenails, and signs of infection, such as redness, swelling, or pus.  Wash and dry your feet thoroughly, especially between the toes.  Avoid soaking your feet regularly in hot water baths.  Moisturize dry skin with lotion, avoiding areas between your toes.  Cut toenails straight across and file the edges.  Avoid shoes that do not fit well or have areas that irritate your skin.  Avoid going barefooted or wearing only socks. Your feet need protection. TAKE CARE OF YOUR TEETH People with poorly controlled diabetes are more likely to have gum (periodontal) disease. These infections make diabetes harder to control. Periodontal diseases, if left untreated, can lead to tooth loss. Brush your teeth twice a day, floss, and see  your dentist for checkups and cleaning every 6 months, or 2 times a year. ASK YOUR HEALTH CARE PROVIDER ABOUT TAKING ASPIRIN Taking aspirin daily is recommended to help prevent cardiovascular disease in people with and without diabetes. Ask your health care provider if this would benefit you and what dose he or she would recommend. DRINK RESPONSIBLY Moderate amounts of alcohol (less than 1 drink per day for adult women and less than 2 drinks per day for adult men) have a minimal effect on blood glucose if ingested with food. It is important to eat food with alcohol to avoid hypoglycemia. People should avoid alcohol if they have a history of alcohol abuse or dependence, if they are pregnant, and if they have liver disease, pancreatitis, advanced neuropathy, or severe hypertriglyceridemia. LESSEN STRESS Living with diabetes can be stressful. When you are under stress, your blood glucose may be affected in two ways:  Stress hormones may cause your blood glucose to rise.  You may be distracted from taking good care of yourself. It is a good idea to be aware of your stress level and make changes that are necessary to help you better manage challenging situations. Support groups, planned relaxation, a hobby you enjoy, meditation, healthy relationships, and exercise all work to lower your stress level. If your efforts do not  seem to be helping, get help from your health care provider or a trained mental health professional.   This information is not intended to replace advice given to you by your health care provider. Make sure you discuss any questions you have with your health care provider.   Document Released: 02/01/2011 Document Revised: 06/06/2014 Document Reviewed: 07/10/2013 Elsevier Interactive Patient Education [employer]

## 2015-06-17 NOTE — Progress Notes (Signed)
Subjective:    Patient ID: Linda Poole, female    DOB: 1965/11/19, 50 y.o.   MRN: [id number]  HPI Linda Poole, a 50 year old female with a history of diabetes type II presents for a hospital follow-up and to establish care. Patient was recently hospitalized on 06/09/2015 with a painful second toe of right foot.  Patient was found to have sepsis from cellulitis involving the right second toe. She was treated with IV antibiotics and had toe amputated on 06/11/2014. She was discharged on oral antibiotics and will need to schedule a follow up appointment with orthopedic surgeon. Patient maintains that current pain intensity is 4/10. She last had Oxycodone this am with moderate relief.   Patient also has a history of uncontrolled diabetes. Patient was found to have a hemoglobin A1C of 15.8. She was not taking medications or following a low carbohydrate diet prior to hospitalization. She was discharged on sliding scale novalog, lantus, and metformin. She states that she has been unable to fill prescription for novalog due to cost constraints. She maintains that she has been taking Metformin and Lantus consistently since hospital discharge.  Patient denies increase appetite, nausea, polydipsia, polyuria, visual disturbances, vomitting and weight loss.  toe osteomyelitis. Orthopedics consulted s/p R foot 2nd ray amputation 1/13.   Past Medical History  Diagnosis Date  . Diabetes mellitus without complication (HCC)   . Tobacco dependence    Past Surgical History  Procedure Laterality Date  . Tubal ligation    . Amputation Right 06/12/2015    Procedure: Right Foot 2nd Ray Amputation;  Surgeon: Nadara Mustard, MD;  Location: [facility] OR;  Service: Orthopedics;  Laterality: Right;  No Known Allergies  Immunization History  Administered Date(s) Administered  . Influenza,inj,Quad PF,36+ Mos 06/13/2015  . Pneumococcal Polysaccharide-23 06/15/2015   Social History   Social History  . Marital Status: Divorced      Spouse Name: N/A  . Number of Children: N/A  . Years of Education: N/A   Occupational History  . Not on file.   Social History Main Topics  . Smoking status: Current Every Day Smoker -- 0.50 packs/day    Types: Cigarettes  . Smokeless tobacco: Never Used  . Alcohol Use: No  . Drug Use: No  . Sexual Activity: Not on file   Other Topics Concern  . Not on file   Social History Narrative   Review of Systems  Constitutional: Negative.  Negative for fatigue.  HENT: Negative.   Eyes: Negative.   Respiratory: Negative.   Cardiovascular: Negative.   Gastrointestinal: Negative.   Endocrine: Negative.  Negative for polydipsia, polyphagia and polyuria.  Genitourinary: Negative.  Negative for dysuria.  Musculoskeletal: Negative.        Pain to right foot  Skin: Negative.        S/P Right second toe amputation  Allergic/Immunologic: Negative.   Neurological: Negative.  Negative for numbness.  Hematological: Negative.   Psychiatric/Behavioral: Negative.  Negative for suicidal ideas and sleep disturbance.       Objective:   Physical Exam  Constitutional: She is oriented to person, place, and time. She appears well-developed and well-nourished.  HENT:  Head: Normocephalic and atraumatic.  Right Ear: External ear normal.  Left Ear: External ear normal.  Mouth/Throat: Oropharynx is clear and moist.  Eyes: Conjunctivae and EOM are normal. Pupils are equal, round, and reactive to light.  Neck: Normal range of motion. Neck supple.  Cardiovascular: Normal rate, regular rhythm, normal heart sounds  and intact distal pulses.   Pulmonary/Chest: Effort normal and breath sounds normal.  Abdominal: Soft. Bowel sounds are normal.  Musculoskeletal: Normal range of motion.  Neurological: She is alert and oriented to person, place, and time. She has normal reflexes.  Skin: Skin is warm and dry.     Psychiatric: She has a normal mood and affect. Her behavior is normal. Judgment and thought  content normal.     BP 136/92 mmHg  Pulse 98  Temp(Src) 98 F (36.7 C) (Oral)  Resp 14  Ht  (1.575 Poole)  Wt 134 lb (60.782 kg)  BMI 24.50 kg/m2 Assessment & Plan:  1. Type 2 diabetes mellitus with complication, with long-term current use of insulin (HCC) There have been some non compliance issues with medication adherence. I have discussed with her the great importance of following the treatment plan exactly as directed in order to achieve a good medical outcome, she expressed understanding. We discussed sliding scale at length and when to check CBGs. We will follow up in 1 month, she is to bring glucometer to follow-up appointment.  - Glucose, capillary - POCT urinalysis dip (device) - Blood Glucose Monitoring Suppl (TRUE METRIX AIR GLUCOSE METER) DEVI; 1 each by Does not apply route 4 (four) times daily.  Dispense: 1 Device; Refill: 0 - insulin aspart (NOVOLOG) 100 UNIT/ML injection; Inject 0-10 Units into the skin 3 (three) times daily before meals.  Dispense: 10 mL; Refill: 11 - Insulin Syringes, Disposable, U-100 0.3 ML MISC; 1 each by Does not apply route 4 (four) times daily.  Dispense: 100 each; Refill: 12 - glucose blood (TRUE METRIX BLOOD GLUCOSE TEST) test strip; Use as instructed  Dispense: 100 each; Refill: 12  2. Status post amputation of lesser toe of right foot [facility]) Patient has the original post surgical dressing, which is intact. There is dried blood to outer aspect of dressing. There does not appear to be active bleeding. I stressed the importance of completing antibiotic and following up with surgeon.   3. Tobacco dependence Patient is ready to quit smoking. Patient states that Nicotine patches worked well during hospitalization. Will continue.  - nicotine (NICODERM CQ) 14 mg/24hr patch; Place 1 patch (14 mg total) onto the skin daily.  Dispense: 28 patch; Refill: 1   RTC: 1 month for DMII   Linda Horen M, FNP   The patient was given clear instructions to go  to ER or return to medical center if symptoms do not improve, worsen or new problems develop. The patient verbalized understanding. Will notify patient with laboratory results.

## 2015-06-18 DIAGNOSIS — F1911 Other psychoactive substance abuse, in remission: Secondary | ICD-10-CM | POA: Insufficient documentation

## 2015-06-18 DIAGNOSIS — F17200 Nicotine dependence, unspecified, uncomplicated: Secondary | ICD-10-CM | POA: Insufficient documentation

## 2015-06-23 MED FILL — traMADol HCL 50 MG TABS: 50 | 8 days supply | Qty: 90 | Fill #0

## 2015-07-01 ENCOUNTER — Emergency Department (HOSPITAL_COMMUNITY)
Admission: EM | Admit: 2015-07-01 | Discharge: 2015-07-01 | Disposition: A | Discharge status: Home / Self Care | Payer: Self-pay | Attending: Emergency Medicine | Admitting: Emergency Medicine

## 2015-07-01 ENCOUNTER — Emergency Department (HOSPITAL_COMMUNITY): Payer: Self-pay

## 2015-07-01 ENCOUNTER — Encounter (HOSPITAL_COMMUNITY): Payer: Self-pay | Admitting: Emergency Medicine

## 2015-07-01 DIAGNOSIS — E119 Type 2 diabetes mellitus without complications: Secondary | ICD-10-CM | POA: Insufficient documentation

## 2015-07-01 DIAGNOSIS — Z7984 Long term (current) use of oral hypoglycemic drugs: Secondary | ICD-10-CM | POA: Insufficient documentation

## 2015-07-01 DIAGNOSIS — Z794 Long term (current) use of insulin: Secondary | ICD-10-CM | POA: Insufficient documentation

## 2015-07-01 DIAGNOSIS — Z89421 Acquired absence of other right toe(s): Secondary | ICD-10-CM | POA: Insufficient documentation

## 2015-07-01 DIAGNOSIS — L0291 Cutaneous abscess, unspecified: Secondary | ICD-10-CM

## 2015-07-01 DIAGNOSIS — F17210 Nicotine dependence, cigarettes, uncomplicated: Secondary | ICD-10-CM | POA: Insufficient documentation

## 2015-07-01 DIAGNOSIS — L02415 Cutaneous abscess of right lower limb: Secondary | ICD-10-CM | POA: Insufficient documentation

## 2015-07-01 LAB — CBC WITH DIFFERENTIAL/PLATELET
BASOS ABS: 0 10*3/uL (ref 0.0–0.1)
BASOS PCT: 0 %
EOS ABS: 0.2 10*3/uL (ref 0.0–0.7)
EOS PCT: 1 %
HEMATOCRIT: 36 % (ref 36.0–46.0)
Hemoglobin: 11.8 g/dL — ABNORMAL LOW (ref 12.0–15.0)
Lymphocytes Relative: 29 %
Lymphs Abs: 4.6 10*3/uL — ABNORMAL HIGH (ref 0.7–4.0)
MCH: 29.9 pg (ref 26.0–34.0)
MCHC: 32.8 g/dL (ref 30.0–36.0)
MCV: 91.1 fL (ref 78.0–100.0)
MONO ABS: 1.3 10*3/uL — AB (ref 0.1–1.0)
MONOS PCT: 8 %
Neutro Abs: 10 10*3/uL — ABNORMAL HIGH (ref 1.7–7.7)
Neutrophils Relative %: 62 %
PLATELETS: 496 10*3/uL — AB (ref 150–400)
RBC: 3.95 MIL/uL (ref 3.87–5.11)
RDW: 14.1 % (ref 11.5–15.5)
WBC: 16.1 10*3/uL — ABNORMAL HIGH (ref 4.0–10.5)

## 2015-07-01 LAB — BASIC METABOLIC PANEL
ANION GAP: 11 (ref 5–15)
BUN: 11 mg/dL (ref 6–20)
CALCIUM: 9.7 mg/dL (ref 8.9–10.3)
CO2: 29 mmol/L (ref 22–32)
CREATININE: 0.6 mg/dL (ref 0.44–1.00)
Chloride: 100 mmol/L — ABNORMAL LOW (ref 101–111)
Glucose, Bld: 80 mg/dL (ref 65–99)
Potassium: 4.2 mmol/L (ref 3.5–5.1)
Sodium: 140 mmol/L (ref 135–145)

## 2015-07-01 LAB — I-STAT CG4 LACTIC ACID, ED: Lactic Acid, Venous: 1.6 mmol/L (ref 0.5–2.0)

## 2015-07-01 MED ORDER — HYDROCODONE-ACETAMINOPHEN 5-325 MG PO TABS
1.0000 | ORAL_TABLET | Freq: Once | ORAL | Status: AC
  Administered 2015-07-01: 1 via ORAL
  Filled 2015-07-01: qty 1

## 2015-07-01 MED ORDER — HEPARIN SOD (PORK) LOCK FLUSH 100 UNIT/ML IV SOLN: 500.0000 [IU] | Freq: Once | INTRAVENOUS | Status: DC

## 2015-07-01 MED ORDER — LIDOCAINE HCL 2 % IJ SOLN
10.0000 mL | Freq: Once | INTRAMUSCULAR | Status: AC
  Administered 2015-07-01: 200 mg via INTRADERMAL
  Filled 2015-07-01: qty 20

## 2015-07-01 MED ORDER — DEXTROSE 5 % IV SOLN
1.0000 g | Freq: Once | INTRAVENOUS | Status: AC
  Administered 2015-07-01: 1 g via INTRAVENOUS
  Filled 2015-07-01: qty 10

## 2015-07-01 MED ORDER — OXYCODONE-ACETAMINOPHEN 5-325 MG PO TABS
1.0000 | ORAL_TABLET | Freq: Once | ORAL | Status: AC
  Administered 2015-07-01: 1 via ORAL
  Filled 2015-07-01: qty 1

## 2015-07-01 NOTE — ED Notes (Signed)
Patient transported to X-ray 

## 2015-07-01 NOTE — ED Notes (Signed)
Provider aware for pt requesting pain medication

## 2015-07-01 NOTE — Discharge Instructions (Signed)
Call Dr. Audrie Lia office to schedule for a visit tomorrow morning.   Abscess An abscess is an infected area that contains a collection of pus and debris.It can occur in almost any part of the body. An abscess is also known as a furuncle or boil. CAUSES  An abscess occurs when tissue gets infected. This can occur from blockage of oil or sweat glands, infection of hair follicles, or a minor injury to the skin. As the body tries to fight the infection, pus collects in the area and creates pressure under the skin. This pressure causes pain. People with weakened immune systems have difficulty fighting infections and get certain abscesses more often.  SYMPTOMS Usually an abscess develops on the skin and becomes a painful mass that is red, warm, and tender. If the abscess forms under the skin, you may feel a moveable soft area under the skin. Some abscesses break open (rupture) on their own, but most will continue to get worse without care. The infection can spread deeper into the body and eventually into the bloodstream, causing you to feel ill.  DIAGNOSIS  Your caregiver will take your medical history and perform a physical exam. A sample of fluid may also be taken from the abscess to determine what is causing your infection. TREATMENT  Your caregiver may prescribe antibiotic medicines to fight the infection. However, taking antibiotics alone usually does not cure an abscess. Your caregiver may need to make a small cut (incision) in the abscess to drain the pus. In some cases, gauze is packed into the abscess to reduce pain and to continue draining the area. HOME CARE INSTRUCTIONS   Only take over-the-counter or prescription medicines for pain, discomfort, or fever as directed by your caregiver.  If you were prescribed antibiotics, take them as directed. Finish them even if you start to feel better.  If gauze is used, follow your caregiver's directions for changing the gauze.  To avoid spreading the  infection:  Keep your draining abscess covered with a bandage.  Wash your hands well.  Do not share personal care items, towels, or whirlpools with others.  Avoid skin contact with others.  Keep your skin and clothes clean around the abscess.  Keep all follow-up appointments as directed by your caregiver. SEEK MEDICAL CARE IF:   You have increased pain, swelling, redness, fluid drainage, or bleeding.  You have muscle aches, chills, or a general ill feeling.  You have a fever. MAKE SURE YOU:   Understand these instructions.  Will watch your condition.  Will get help right away if you are not doing well or get worse.   This information is not intended to replace advice given to you by your health care provider. Make sure you discuss any questions you have with your health care provider.   Document Released: 02/23/2005 Document Revised: 11/15/2011 Document Reviewed: 07/29/2011 Elsevier Interactive Patient Education 2016 [employer]  Incision and Drainage Incision and drainage is a procedure in which a sac-like structure (cystic structure) is opened and drained. The area to be drained usually contains material such as pus, fluid, or blood.  LET YOUR CAREGIVER KNOW ABOUT:   Allergies to medicine.  Medicines taken, including vitamins, herbs, eyedrops, over-the-counter medicines, and creams.  Use of steroids (by mouth or creams).  Previous problems with anesthetics or numbing medicines.  History of bleeding problems or blood clots.  Previous surgery.  Other health problems, including diabetes and kidney problems.  Possibility of pregnancy, if this applies. RISKS AND  COMPLICATIONS  Pain.  Bleeding.  Scarring.  Infection. BEFORE THE PROCEDURE  You may need to have an ultrasound or other imaging tests to see how large or deep your cystic structure is. Blood tests may also be used to determine if you have an infection or how severe the infection is. You may need  to have a tetanus shot. PROCEDURE  The affected area is cleaned with a cleaning fluid. The cyst area will then be numbed with a medicine (local anesthetic). A small incision will be made in the cystic structure. A syringe or catheter may be used to drain the contents of the cystic structure, or the contents may be squeezed out. The area will then be flushed with a cleansing solution. After cleansing the area, it is often gently packed with a gauze or another wound dressing. Once it is packed, it will be covered with gauze and tape or some other type of wound dressing. AFTER THE PROCEDURE   Often, you will be allowed to go home right after the procedure.  You may be given antibiotic medicine to prevent or heal an infection.  If the area was packed with gauze or some other wound dressing, you will likely need to come back in 1 to 2 days to get it removed.  The area should heal in about 14 days.   This information is not intended to replace advice given to you by your health care provider. Make sure you discuss any questions you have with your health care provider.   Document Released: 11/09/2000 Document Revised: 11/15/2011 Document Reviewed: 07/11/2011 Elsevier Interactive Patient Education [employer]

## 2015-07-01 NOTE — ED Provider Notes (Signed)
CSN: [id number]     Arrival date & time 07/01/15  1504 History   First MD Initiated Contact with Patient 07/01/15 1655     Chief Complaint  Patient presents with  . Abscess   HPI  Linda Poole is a 50 year old female with PMHx of DM and tobacco use presenting with foot pain and abscess. She was recently discharged from the hospital on January 10 to having her second digit of the right foot amputated. She states that she has been wearing a cast since. She noted on the cast was removed 2 weeks ago that her ankle did not appear erythematous and her surgical wound was healing well. Approximately one week ago, she noted a small abscess forming over her anterior distal tib-fib. The skin around the abscess is erythematous and the area is extremely painful. She is also noting swelling of her ankle and pain extending into the midfoot. She describes the pain as burning. She is also concerned about purulent drainage from her surgical wound. She states that it looked "really good "that 2 weeks ago when she removes the dressing yesterday she noted white drainage. She states there is also increased pain at the surgical site. Reports compliance with her antibiotics. Should a follow-up appointment with orthopedics, Dr. Lajoyce Corners, who reported good healing and did not prescribe more antibiotics. She is concerned because the quality of pain she is experiencing now with the same that she had when her toe needed to be amputated. Decreased range of motion of the ankle and toes secondary to pain. She states that she is still able to ambulate though it is extremely painful. She denies fevers, chills, nausea, vomiting, dizziness, syncope or any other systemic symptoms. She denies extension of the erythema above her ankle. She denies wounds to the foot. She states that her blood sugars very widely when she is at home. She states that he get slightly below 100 and slightly below 200. She denies loss of sensation in the foot. She has no other  complaints today.  Past Medical History  Diagnosis Date  . Diabetes mellitus without complication (HCC)   . Tobacco dependence    Past Surgical History  Procedure Laterality Date  . Tubal ligation    . Amputation Right 06/12/2015    Procedure: Right Foot 2nd Ray Amputation;  Surgeon: Nadara Mustard, MD;  Location: [facility] OR;  Service: Orthopedics;  Laterality: Right;   Family History  Problem Relation Age of Onset  . Hypertension Mother   . Stroke Mother   . Diabetes Mellitus II Father    Social History  Substance Use Topics  . Smoking status: Current Every Day Smoker -- 0.50 packs/day    Types: Cigarettes  . Smokeless tobacco: Never Used  . Alcohol Use: No   OB History    No data available     Review of Systems  Musculoskeletal: Positive for joint swelling and arthralgias.  Skin: Positive for wound.  All other systems reviewed and are negative.     Allergies  Review of patient's allergies indicates no known allergies.  Home Medications   Prior to Admission medications   Medication Sig Start Date End Date Taking? Authorizing Provider  insulin aspart (NOVOLOG FLEXPEN) 100 UNIT/ML FlexPen Inject 0-15 Units into the skin 3 (three) times daily with meals. CBG < 70: Eat or drink something and recheck, CBG 70 - 120: 0 units CBG 121 - 150: 2 units CBG 151 - 200: 3 units CBG 201 - 250: 5  units CBG 251 - 300: 8 units CBG 301 - 350: 11 units CBG 351 - 400: 15 units CBG > 400: call MD. 06/15/15  Yes Elease Etienne, MD  Insulin Detemir (LEVEMIR) 100 UNIT/ML Pen Inject 20 Units into the skin daily at 10 pm. 06/15/15  Yes Elease Etienne, MD  metFORMIN (GLUCOPHAGE) 500 MG tablet Take 2 tablets (1,000 mg total) by mouth 2 (two) times daily with a meal. 06/15/15  Yes Elease Etienne, MD  methadone (DOLOPHINE) 10 MG/5ML solution Take 120 mg by mouth every morning.   Yes Historical Provider, MD  nicotine (NICODERM CQ) 14 mg/24hr patch Place 1 patch (14 mg total) onto the skin daily.  06/17/15  Yes Massie Maroon, FNP  amoxicillin (AMOXIL) 500 MG tablet Take 1 tablet (500 mg total) by mouth 3 (three) times daily. Patient not taking: Reported on 07/01/2015 06/15/15   Elease Etienne, MD  Blood Glucose Monitoring Suppl (TRUE METRIX AIR GLUCOSE METER) DEVI 1 each by Does not apply route 4 (four) times daily. 06/17/15   Massie Maroon, FNP  glucose blood (TRUE METRIX BLOOD GLUCOSE TEST) test strip Use as instructed 06/17/15   Massie Maroon, FNP  insulin aspart (NOVOLOG) 100 UNIT/ML injection Inject 0-10 Units into the skin 3 (three) times daily before meals. Patient not taking: Reported on 07/01/2015 06/17/15   Massie Maroon, FNP  Insulin Pen Needle 31G X 5 MM MISC Use as directed, 3 times a day with meals and at bedtime. 06/15/15   Elease Etienne, MD  Insulin Syringes, Disposable, U-100 0.3 ML MISC 1 each by Does not apply route 4 (four) times daily. 06/17/15   Massie Maroon, FNP  oxyCODONE (OXY IR/ROXICODONE) 5 MG immediate release tablet Take 1-2 tablets (5-10 mg total) by mouth every 4 (four) hours as needed for moderate pain, severe pain or breakthrough pain. Patient not taking: Reported on 07/01/2015 06/15/15   Elease Etienne, MD   BP 113/82 mmHg  Pulse 103  Temp(Src) 98.1 F (36.7 C) (Oral)  Resp 16  Ht 5\' 2"  (1.575 m)  Wt 66.497 kg  BMI 26.81 kg/m2  SpO2 98% Physical Exam  Constitutional: She appears well-developed and well-nourished. No distress.  HENT:  Head: Normocephalic and atraumatic.  Eyes: Conjunctivae are normal. Right eye exhibits no discharge. Left eye exhibits no discharge. No scleral icterus.  Neck: Normal range of motion.  Cardiovascular: Normal rate, regular rhythm and intact distal pulses.   Cap refill less than 3 seconds.  Pulmonary/Chest: Effort normal. No respiratory distress.  Musculoskeletal:       Right foot: There is decreased range of motion, tenderness and swelling. There is normal capillary refill.       Feet:  Second toe of  right foot status post amputated. Surgical wound noted from the base of second toe to midfoot. Distal aspect of the surgical wound with granulation tissue. Sutures all in place. No erythema surrounding surgical site. Small fluctuant abscess noted to right anterior distal tib-fib. Surrounding erythema of the skin. Right ankle appears swollen. Restricted range of motion of the right ankle secondary to pain. Full range of motion of the toes. Tenderness is generalized over the right ankle and midfoot.  Neurological: She is alert. Coordination normal.  Refuses ankle strength testing secondary to pain the patient is able to ambulate. Sensation to light touch intact over the right foot.  Skin: Skin is warm and dry.  Psychiatric: She has a normal mood and affect.  Her behavior is normal.  Nursing note and vitals reviewed.   ED Course  Procedures (including critical care time)  INCISION AND DRAINAGE Performed by: Simeon Craft Consent: Verbal consent obtained. Risks and benefits: risks, benefits and alternatives were discussed Type: abscess  Body area: right lower leg  Anesthesia: local infiltration  Incision was made with a scalpel.  Local anesthetic: lidocaine 2% without epinephrine  Anesthetic total: 5 ml  Complexity: complex Blunt dissection to break up loculations  Drainage: purulent  Drainage amount: moderate  Packing material: none  Patient tolerance: Patient tolerated the procedure well with no immediate complications.    Labs Review Labs Reviewed  CBC WITH DIFFERENTIAL/PLATELET - Abnormal; Notable for the following:    WBC 16.1 (*)    Hemoglobin 11.8 (*)    Platelets 496 (*)    Neutro Abs 10.0 (*)    Lymphs Abs 4.6 (*)    Monocytes Absolute 1.3 (*)    All other components within normal limits  BASIC METABOLIC PANEL - Abnormal; Notable for the following:    Chloride 100 (*)    All other components within normal limits  I-STAT CG4 LACTIC ACID, ED    Imaging  Review Dg Ankle Complete Right  07/01/2015  CLINICAL DATA:  Painful red circular spot on anterior distal right length EXAM: RIGHT ANKLE - COMPLETE 3+ VIEW COMPARISON:  None. FINDINGS: No focal abnormality is seen in the distal tibia or fibula. There is a small focus of soft tissue attenuation in the distal subcutaneous tissues of the leg. No associated gas in this region of soft tissues. IMPRESSION: Small soft tissue nodule in the subcutaneous tissues of the anterior aspect of the distal leg. This could be a cyst, abscess, or soft tissue lesion. Electronically Signed   By: Kennith Center M.D.   On: 07/01/2015 17:58   Dg Foot Complete Right  07/01/2015  CLINICAL DATA:  50 year old female status post recent amputation of the right second toe, with continued drainage from the surgical site and pain. History of diabetes mellitus. EXAM: RIGHT FOOT COMPLETE - 3+ VIEW COMPARISON:  06/09/2015. FINDINGS: Compared to the prior study there has been amputation of the second toe beyond the proximal half of the metatarsal. Soft tissues are grossly unremarkable. No unexpected retained radiopaque foreign body in the soft tissues. No acute displaced fracture, subluxation or dislocation. Small plantar calcaneal enthesophyte. IMPRESSION: 1. Expected postoperative appearance of right foot following amputation of the right second toe, as above. Electronically Signed   By: Trudie Reed M.D.   On: 07/01/2015 17:58   I have personally reviewed and evaluated these images and lab results as part of my medical decision-making.   EKG Interpretation None      MDM   Final diagnoses:  Abscess   50 year old female with abscess to the right distal tib-fib 7 days. Patient is also concerned about her surgical wound from a second toe amputation ago. Vital signs stable. 2 similar abscess noted to the right distal lower leg with surrounding erythema. No streaking up the leg. Ankle is slightly swollen. Restricted range of motion  secondary to pain. Surgical wound noted over midfoot to base of second toe with granulation tissue present. No purulent drainage. No erythema surrounding surgical site. Full range of motion of the toes intact. Cap refill less than 3 seconds. Patient is still able to ambulate. I&D performed at bedside with moderate purulent drainage. White blood cell count elevated to 16. Remaining lab work unremarkable. Lactic acid 1.6. X-ray  of foot shows soft tissue swelling and no signs of osteomyelitis. Discussed with orthopedics, Dr. August Saucer, who recommends 1 g Rocephin in the emergency department and follow-up with Dr. Lajoyce Corners tomorrow. Discussed the plan with the patient at bedside who states understanding. Return precautions given in discharge paperwork and discussed with pt at bedside. Pt stable for discharge     Alveta Heimlich, PA-C 07/01/15 2010  Nelva Nay, MD 07/01/15 2019

## 2015-07-01 NOTE — ED Notes (Signed)
Pt from home for eval of abscess to right anterior ankle x7 days, states had recent toe amputation due to infection in toes  On 01/13 and thinks she has another infection. Pt is diabetic. Pt denies any fevers, n/v/d. nad noted. reddness and swelling noted to leg.

## 2015-07-03 ENCOUNTER — Ambulatory Visit: Payer: Self-pay | Admitting: Family Medicine

## 2015-07-07 MED FILL — TRUEPLUS SYR 0.3ML 31GX5/16: 31G X 5/16" | 25 days supply | Qty: 100 | Fill #0

## 2015-07-08 MED FILL — ?DOXYCYCLINE MONO 100 MG TA: 100 | 30 days supply | Qty: 60 | Fill #0

## 2015-07-08 MED FILL — traMADol HCL 50 MG TABS: 50 | 8 days supply | Qty: 90 | Fill #0

## 2015-07-09 ENCOUNTER — Other Ambulatory Visit: Payer: Self-pay

## 2015-07-10 ENCOUNTER — Other Ambulatory Visit: Payer: Self-pay | Admitting: General Practice

## 2015-07-10 LAB — FUNGUS CULTURE W SMEAR: FUNGAL SMEAR: NONE SEEN

## 2015-07-10 MED ORDER — INSULIN ASPART 100 UNIT/ML FLEXPEN: 0.0000 [IU] | PEN_INJECTOR | Freq: Three times a day (TID) | SUBCUTANEOUS | Status: DC

## 2015-07-10 MED ORDER — INSULIN PEN NEEDLE 31G X 5 MM MISC: Status: AC

## 2015-07-10 NOTE — Telephone Encounter (Signed)
Insulin has been refilled. Thanks!

## 2015-07-16 MED FILL — !NOVOLOG 100UNITS/ML VIAL: 100/ML | 28 days supply | Qty: 10 | Fill #1

## 2015-07-16 MED FILL — TRUE METRIX TEST STRIP: 25 days supply | Qty: 100 | Fill #1

## 2015-07-22 ENCOUNTER — Ambulatory Visit (INDEPENDENT_AMBULATORY_CARE_PROVIDER_SITE_OTHER): Payer: Self-pay | Admitting: Family Medicine

## 2015-07-22 ENCOUNTER — Encounter: Payer: Self-pay | Admitting: Family Medicine

## 2015-07-22 VITALS — BP 150/102 | HR 108 | Temp 97.5°F | Resp 16 | Ht 62.0 in | Wt 147.0 lb

## 2015-07-22 DIAGNOSIS — L089 Local infection of the skin and subcutaneous tissue, unspecified: Secondary | ICD-10-CM

## 2015-07-22 DIAGNOSIS — S91104A Unspecified open wound of right lesser toe(s) without damage to nail, initial encounter: Secondary | ICD-10-CM | POA: Insufficient documentation

## 2015-07-22 DIAGNOSIS — Z794 Long term (current) use of insulin: Secondary | ICD-10-CM

## 2015-07-22 DIAGNOSIS — E118 Type 2 diabetes mellitus with unspecified complications: Secondary | ICD-10-CM

## 2015-07-22 DIAGNOSIS — E1169 Type 2 diabetes mellitus with other specified complication: Secondary | ICD-10-CM

## 2015-07-22 DIAGNOSIS — E11628 Type 2 diabetes mellitus with other skin complications: Secondary | ICD-10-CM

## 2015-07-22 DIAGNOSIS — I10 Essential (primary) hypertension: Secondary | ICD-10-CM

## 2015-07-22 DIAGNOSIS — F17200 Nicotine dependence, unspecified, uncomplicated: Secondary | ICD-10-CM

## 2015-07-22 DIAGNOSIS — E1142 Type 2 diabetes mellitus with diabetic polyneuropathy: Secondary | ICD-10-CM

## 2015-07-22 DIAGNOSIS — S91104S Unspecified open wound of right lesser toe(s) without damage to nail, sequela: Secondary | ICD-10-CM

## 2015-07-22 LAB — POCT URINALYSIS DIP (DEVICE)
BILIRUBIN URINE: NEGATIVE
Glucose, UA: 500 mg/dL — AB
Hgb urine dipstick: NEGATIVE
Ketones, ur: NEGATIVE mg/dL
Leukocytes, UA: NEGATIVE
NITRITE: NEGATIVE
PH: 6 (ref 5.0–8.0)
PROTEIN: NEGATIVE mg/dL
Specific Gravity, Urine: 1.01 (ref 1.005–1.030)
UROBILINOGEN UA: 0.2 mg/dL (ref 0.0–1.0)

## 2015-07-22 LAB — GLUCOSE, CAPILLARY: GLUCOSE-CAPILLARY: 301 mg/dL — AB (ref 65–99)

## 2015-07-22 MED ORDER — GABAPENTIN 300 MG PO CAPS: 300.0000 mg | ORAL_CAPSULE | Freq: Three times a day (TID) | ORAL | Status: DC

## 2015-07-22 MED ORDER — LISINOPRIL 10 MG PO TABS
10.0000 mg | ORAL_TABLET | Freq: Every day | ORAL | Status: DC
Start: 2015-07-22 — End: 2015-08-19

## 2015-07-22 MED FILL — GABAPENTIN 300 MG CAPSULE: 300 | 30 days supply | Qty: 90 | Fill #0

## 2015-07-22 MED FILL — LISINOPRIL 10 MG TABLET: 10 | 30 days supply | Qty: 30 | Fill #0

## 2015-07-22 NOTE — Patient Instructions (Signed)
Continue Sliding Scale with Novolog CBG 70 - 120: 0 units  CBG 121 - 150: 2 units  CBG 151 - 200: 3 units  CBG 201 - 250: 5 units  CBG 251 - 300: 8 units  CBG 301 - 350: 11 units  CBG 351 - 400: 15 units  CBG > 400: call MD   Will start Gabapentin (neurontin) 300 mg three times per day for diabetic neuropathy Will send a referral to wound center.

## 2015-07-22 NOTE — Progress Notes (Signed)
Subjective:    Patient ID: Linda Poole, female    DOB: [birth year], 50 y.o.   MRN: [id number]  HPI [name], a 50 year old female with a history of diabetes type II presents for a follow up of diabetes. She has been taking medications inconsistently. Previous hemoglobin a1C was 15.8. She also has not been following a carbohydrate modified diet.  She states that she often skips Levemir. She states thatshe has been using sliding scale insulin, but she needs more education on how to use. She is accompanied by her daughter and sister, who will assist her with medication regimen. increase appetite, nausea, polydipsia, polyuria, visual disturbances, vomitting and weight loss.   Patient was hospitalized on 06/09/2015 with a painful second toe of right foot.  Patient was found to have sepsis from cellulitis involving the right second toe. She was treated with IV antibiotics and had toe amputated on 06/11/2014. She was discharged on oral antibiotics and will was discharged from orthopedic surgeon 1 week ago. She states that she has not completed course of antibiotics and her amputation site appears to be infected. She reports increased pain along with yellow drainage.  Patient maintains that current pain intensity is 4/10. She is currently not on pain medications and describes pain as constant and throbbing. She is current a patient at a local methadone clinic. She has not attempted any OTC interventions to alleviate pain.    Past Medical History  Diagnosis Date  . Diabetes mellitus without complication (HCC)   . Tobacco dependence    Past Surgical History  Procedure Laterality Date  . Tubal ligation    . Amputation Right 06/12/2015    Procedure: Right Foot 2nd Ray Amputation;  Surgeon: Nadara Mustard, MD;  Location: [facility] OR;  Service: Orthopedics;  Laterality: Right;  No Known Allergies  Immunization History  Administered Date(s) Administered  . Influenza,inj,Quad PF,36+ Mos 06/13/2015  .  Pneumococcal Polysaccharide-23 06/15/2015   Social History   Social History  . Marital Status: Divorced    Spouse Name: N/A  . Number of Children: N/A  . Years of Education: N/A   Occupational History  . Not on file.   Social History Main Topics  . Smoking status: Current Every Day Smoker -- 0.50 packs/day    Types: Cigarettes  . Smokeless tobacco: Never Used  . Alcohol Use: No  . Drug Use: No  . Sexual Activity: Not on file   Other Topics Concern  . Not on file   Social History Narrative   Review of Systems  Constitutional: Negative.  Negative for fatigue.  HENT: Negative.   Eyes: Negative.   Respiratory: Negative.   Cardiovascular: Negative.   Gastrointestinal: Negative.   Endocrine: Negative.  Negative for polydipsia, polyphagia and polyuria.  Genitourinary: Negative.  Negative for dysuria.  Musculoskeletal: Negative.        Pain to right foot  Skin: Negative.        S/P Right second toe amputation  Allergic/Immunologic: Negative.   Neurological: Negative.  Negative for numbness.  Hematological: Negative.   Psychiatric/Behavioral: Negative.  Negative for suicidal ideas and sleep disturbance.       Objective:   Physical Exam  Constitutional: She is oriented to person, place, and time. She appears well-developed and well-nourished.  HENT:  Head: Normocephalic and atraumatic.  Right Ear: External ear normal.  Left Ear: External ear normal.  Mouth/Throat: Oropharynx is clear and moist.  Eyes: Conjunctivae and EOM are normal. Pupils are equal,  round, and reactive to light.  Neck: Normal range of motion. Neck supple.  Cardiovascular: Normal rate, regular rhythm, normal heart sounds and intact distal pulses.   Pulmonary/Chest: Effort normal and breath sounds normal.  Abdominal: Soft. Bowel sounds are normal.  Musculoskeletal: Normal range of motion.  Neurological: She is alert and oriented to person, place, and time. She has normal reflexes.  Skin: Skin is  warm and dry.     Psychiatric: She has a normal mood and affect. Her behavior is normal. Judgment and thought content normal.     BP 150/102 mmHg  Pulse 108  Temp(Src) 97.5 F (36.4 C) (Oral)  Resp 16  Ht  (1.575 m)  Wt 147 lb (66.679 kg)  BMI 26.88 kg/m2 Assessment & Plan:   1. Diabetic foot infection (HCC) Second toe amputation site appears to be infected. She currently has a moderate amount of yellow/tan discharge, with 10% granulation. [name] will need further evaluation from wound care specialist. Recommend that she complete antibiotic regimen.  - AMB referral to wound care center  2. Type 2 diabetes mellitus with complication, with long-term current use of insulin (HCC) Will continue Novalog per sliding scale and Levemir 20 units HSThere have been some non compliance issues with medication adherence. I have discussed with her the great importance of following the treatment plan exactly as directed in order to achieve a good medical outcome, she expressed understanding. She has not been taking medication consistently. Recommend that she follow sliding scale. She was given a copy of sliding scale. Spent a good amount of time educating Ms. Nylen, her daughter and sister on sliding scale. Patient is to follow up in 1 month.  3. Open wound of second toe of right foot, sequela - AMB referral to wound care center  4. Diabetic polyneuropathy associated with type 2 diabetes mellitus (HCC) - gabapentin (NEURONTIN) 300 MG capsule; Take 1 capsule (300 mg total) by mouth 3 (three) times daily.  Dispense: 90 capsule; Refill: 3  5. Essential hypertension Blood pressure is not currently at goal. Will start Lisinopril 10 mg daily. Will follow up in office in 1 month.  The patient is asked to make an attempt to improve diet and exercise patterns to aid in medical management of this problem. - lisinopril (PRINIVIL,ZESTRIL) 10 MG tablet; Take 1 tablet (10 mg total) by mouth daily.  Dispense:  30 tablet; Refill: 3  6. Tobacco dependence Smoking cessation instruction/counseling given:  counseled patient on the dangers of tobacco use, advised patient to stop smoking, and reviewed strategies to maximize success   RTC: 1 month for DMII and right foot wound   Beth Spackman M, FNP   The patient was given clear instructions to go to ER or return to medical center if symptoms do not improve, worsen or new problems develop. The patient verbalized understanding.

## 2015-07-24 ENCOUNTER — Encounter: Payer: Self-pay | Admitting: Family Medicine

## 2015-07-25 LAB — WOUND CULTURE
Gram Stain: NONE SEEN
Gram Stain: NONE SEEN

## 2015-07-26 LAB — AFB CULTURE WITH SMEAR (NOT AT ARMC): ACID FAST SMEAR: NONE SEEN

## 2015-07-27 ENCOUNTER — Other Ambulatory Visit: Payer: Self-pay

## 2015-07-27 ENCOUNTER — Other Ambulatory Visit: Payer: Self-pay | Admitting: Family Medicine

## 2015-07-27 DIAGNOSIS — E1142 Type 2 diabetes mellitus with diabetic polyneuropathy: Secondary | ICD-10-CM

## 2015-07-27 DIAGNOSIS — L089 Local infection of the skin and subcutaneous tissue, unspecified: Principal | ICD-10-CM

## 2015-07-27 DIAGNOSIS — E11628 Type 2 diabetes mellitus with other skin complications: Secondary | ICD-10-CM

## 2015-07-27 MED ORDER — GABAPENTIN 300 MG PO CAPS: 300.0000 mg | ORAL_CAPSULE | Freq: Three times a day (TID) | ORAL | Status: DC

## 2015-07-27 MED ORDER — CEPHALEXIN 500 MG PO CAPS: 500.0000 mg | ORAL_CAPSULE | Freq: Four times a day (QID) | ORAL | Status: AC

## 2015-07-27 MED FILL — CEPHALEXIN 500 MG CAPSULE: 500 | 10 days supply | Qty: 40 | Fill #0

## 2015-07-27 NOTE — Progress Notes (Signed)
Spoke with patient and advised of culture results and to start keflex 4 times daily as directed. Patient verbalized understanding. Thanks!

## 2015-07-27 NOTE — Progress Notes (Signed)
Reviewed labs. Sent a wound culture of right 2nd toe abscess post surgical site. Abundant group B strep agalactiae. Will start a course of Keflex 500 mg every 6 hours.   Meds ordered this encounter  Medications  . cephALEXin (KEFLEX) 500 MG capsule    Sig: Take 1 capsule (500 mg total) by mouth 4 (four) times daily.    Dispense:  40 capsule    Refill:  0    Lorraina Spring M, FNP

## 2015-07-27 NOTE — Telephone Encounter (Signed)
Refill for gabapentin sent into pharmacy. Thanks!  

## 2015-08-11 ENCOUNTER — Encounter (HOSPITAL_BASED_OUTPATIENT_CLINIC_OR_DEPARTMENT_OTHER): Payer: Self-pay | Attending: Surgery

## 2015-08-11 DIAGNOSIS — Z79891 Long term (current) use of opiate analgesic: Secondary | ICD-10-CM | POA: Insufficient documentation

## 2015-08-11 DIAGNOSIS — F17210 Nicotine dependence, cigarettes, uncomplicated: Secondary | ICD-10-CM | POA: Insufficient documentation

## 2015-08-11 DIAGNOSIS — Z79899 Other long term (current) drug therapy: Secondary | ICD-10-CM | POA: Insufficient documentation

## 2015-08-11 DIAGNOSIS — I10 Essential (primary) hypertension: Secondary | ICD-10-CM | POA: Insufficient documentation

## 2015-08-11 DIAGNOSIS — E1140 Type 2 diabetes mellitus with diabetic neuropathy, unspecified: Secondary | ICD-10-CM | POA: Insufficient documentation

## 2015-08-11 DIAGNOSIS — Z794 Long term (current) use of insulin: Secondary | ICD-10-CM | POA: Insufficient documentation

## 2015-08-11 DIAGNOSIS — L97511 Non-pressure chronic ulcer of other part of right foot limited to breakdown of skin: Secondary | ICD-10-CM | POA: Insufficient documentation

## 2015-08-11 DIAGNOSIS — Z89421 Acquired absence of other right toe(s): Secondary | ICD-10-CM | POA: Insufficient documentation

## 2015-08-11 DIAGNOSIS — E11621 Type 2 diabetes mellitus with foot ulcer: Secondary | ICD-10-CM | POA: Insufficient documentation

## 2015-08-19 ENCOUNTER — Telehealth: Payer: Self-pay

## 2015-08-19 DIAGNOSIS — E1142 Type 2 diabetes mellitus with diabetic polyneuropathy: Secondary | ICD-10-CM

## 2015-08-19 DIAGNOSIS — I10 Essential (primary) hypertension: Secondary | ICD-10-CM

## 2015-08-19 MED ORDER — INSULIN ASPART 100 UNIT/ML FLEXPEN: 0.0000 [IU] | PEN_INJECTOR | Freq: Three times a day (TID) | SUBCUTANEOUS | Status: AC

## 2015-08-19 MED ORDER — LISINOPRIL 10 MG PO TABS: 10.0000 mg | ORAL_TABLET | Freq: Every day | ORAL | Status: AC

## 2015-08-19 MED ORDER — GABAPENTIN 300 MG PO CAPS: 300.0000 mg | ORAL_CAPSULE | Freq: Three times a day (TID) | ORAL | Status: AC

## 2015-08-19 MED FILL — GABAPENTIN 300 MG CAPSULE: 300 | 30 days supply | Qty: 90 | Fill #0

## 2015-08-19 MED FILL — LISINOPRIL 10 MG TABLET: 10 | 30 days supply | Qty: 30 | Fill #0

## 2015-08-19 NOTE — Telephone Encounter (Signed)
Pt is requesting a refill on the following medications: Gabapentin, Lisinopril, Novolog. Thanks!

## 2015-08-19 NOTE — Telephone Encounter (Signed)
REFILLS SENT INTO PHARMACY. THANKS!  

## 2015-08-24 MED FILL — !NOVOLOG 100UNITS/ML VIAL: 100/ML | 28 days supply | Qty: 10 | Fill #2

## 2015-08-26 ENCOUNTER — Ambulatory Visit: Payer: Self-pay | Admitting: Family Medicine

## 2015-10-01 ENCOUNTER — Ambulatory Visit: Payer: Self-pay | Admitting: Family Medicine

## 2017-02-08 IMAGING — MR MR FOOT*R* W/O CM
4 of 5 series · 20 of 40 positions shown · non-contrast
Comparison: Radiographs dated 06/09/2015

CLINICAL DATA: Pain and cellulitis of the second toe of the right
foot.

EXAM:
MRI OF THE RIGHT FOREFOOT WITHOUT CONTRAST
TECHNIQUE: Multiplanar, multisequence MR imaging was performed. No intravenous
contrast was administered.

[Series 3: T2 fat-sat · coronal · 4.0mm · 0.29mm/px · 9 of 23 slices shown]
[im 1/23]
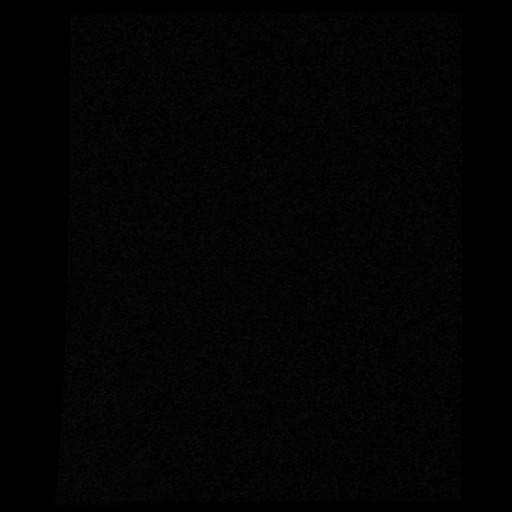
[im 3/23]
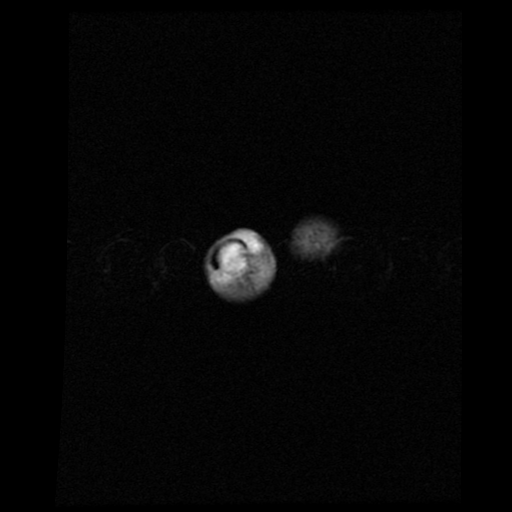
[im 6/23]
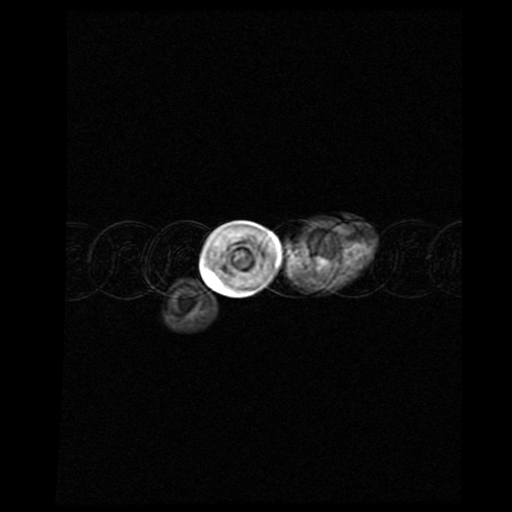
[im 9/23]
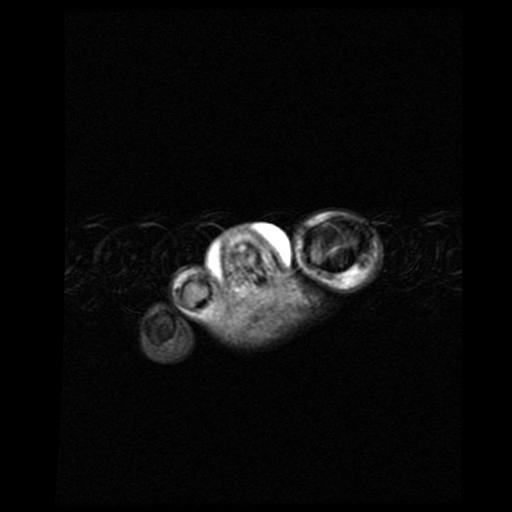
[im 12/23]
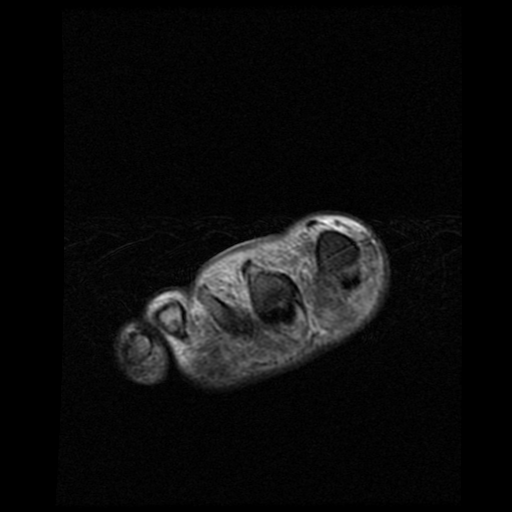
[im 14/23]
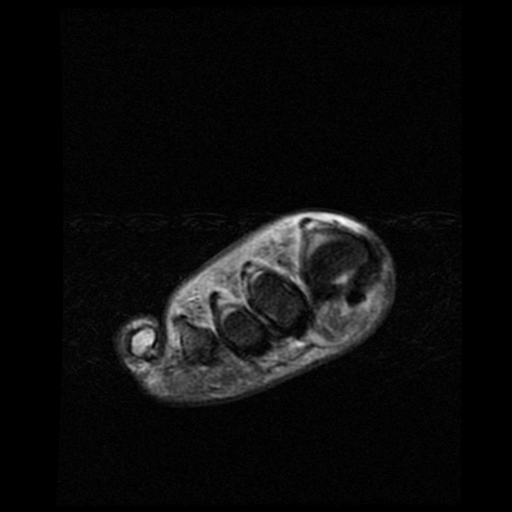
[im 17/23]
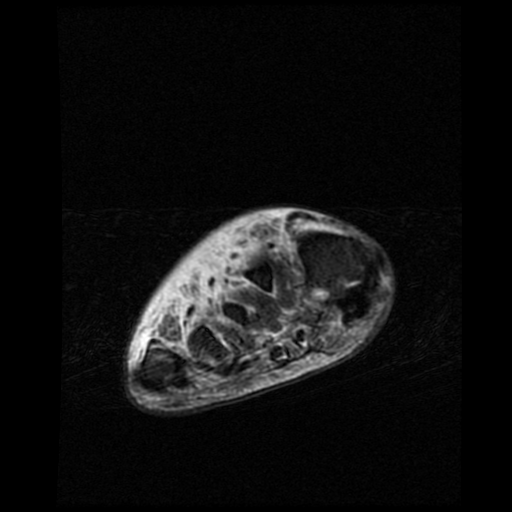
[im 20/23]
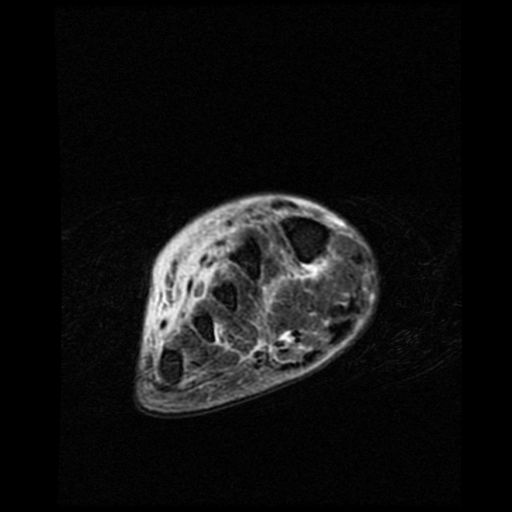
[im 23/23]
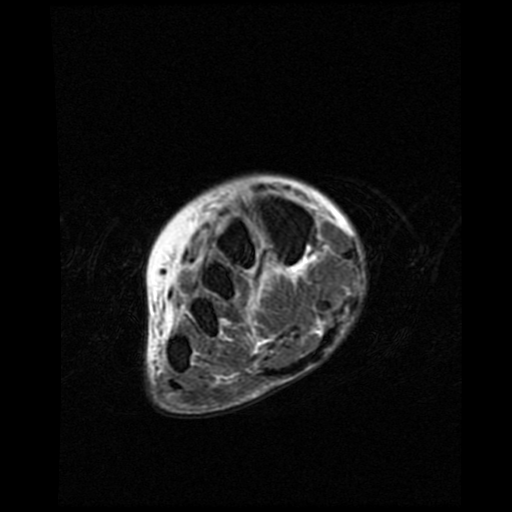

[Series 4: T1 · coronal · 4.0mm · 0.29mm/px · 5 of 23 slices shown (1 of 2)]
[im 1/23]
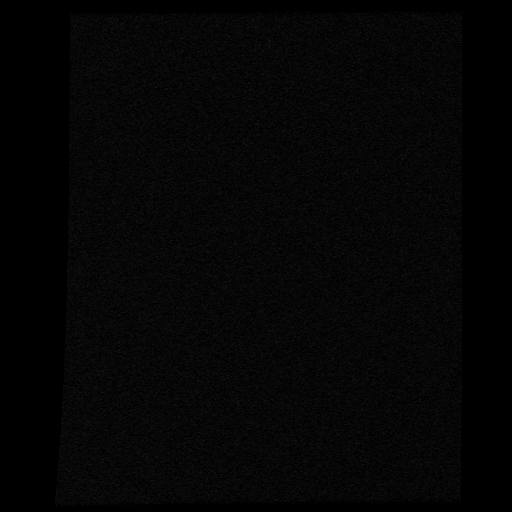
[im 3/23]
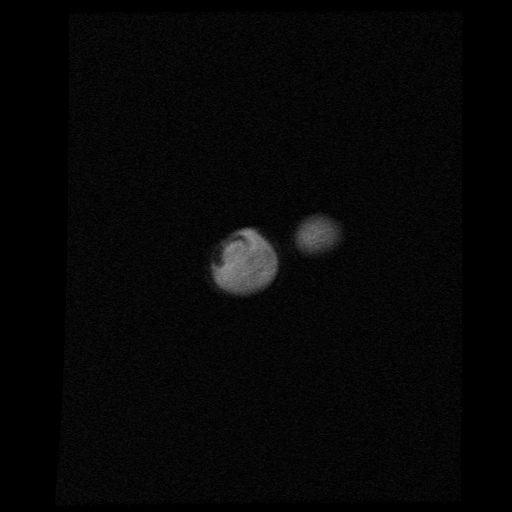
[im 6/23]
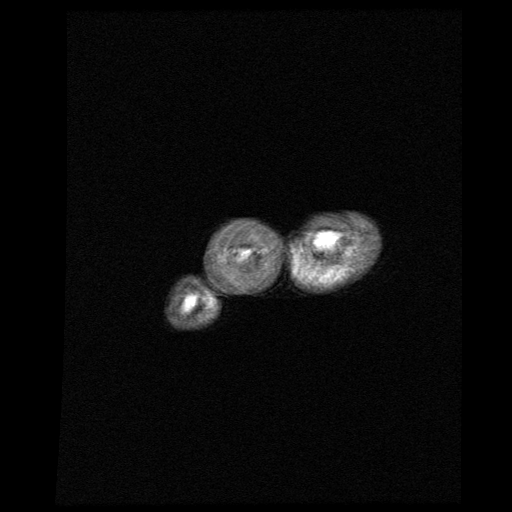
[im 12/23]
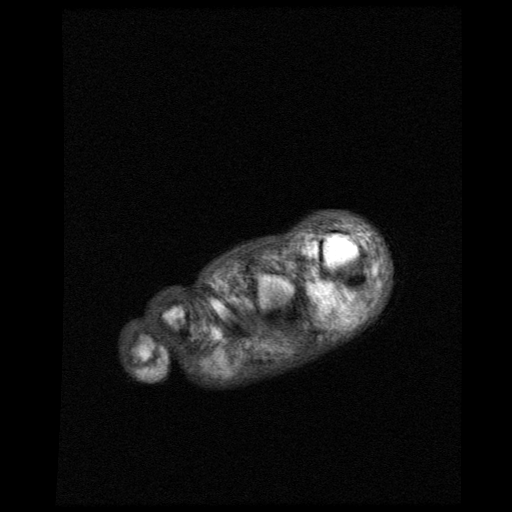
[im 20/23]
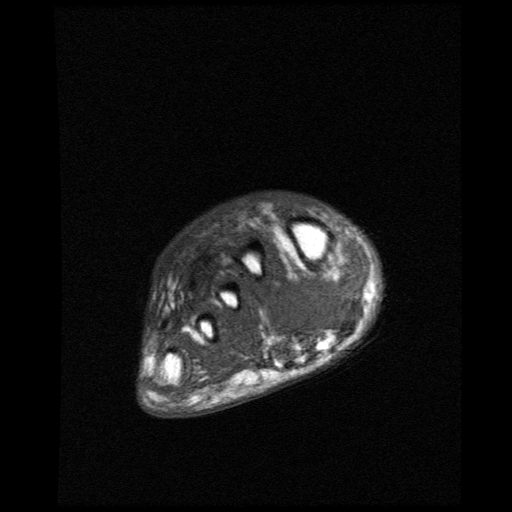

[Series 5: T1 · axial · 3.0mm · 0.25mm/px · z∈[-50,-7]mm · 3 of 18 slices shown (2 of 2)]
[im 3/18]
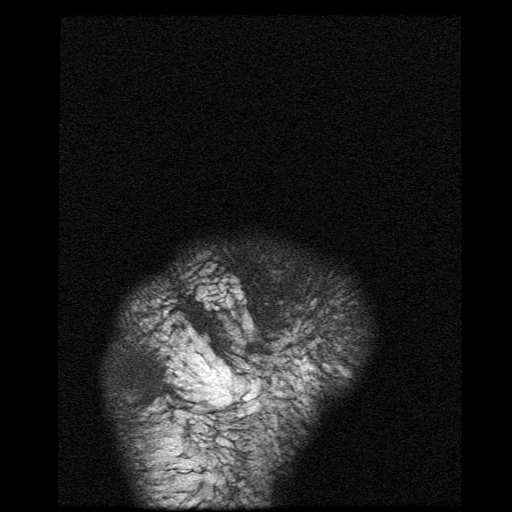
[im 9/18]
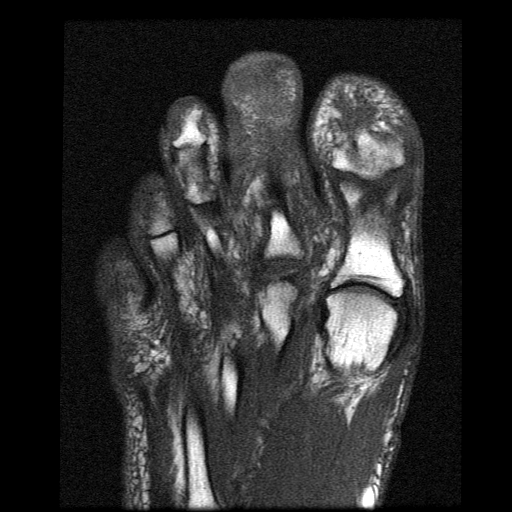
[im 15/18]
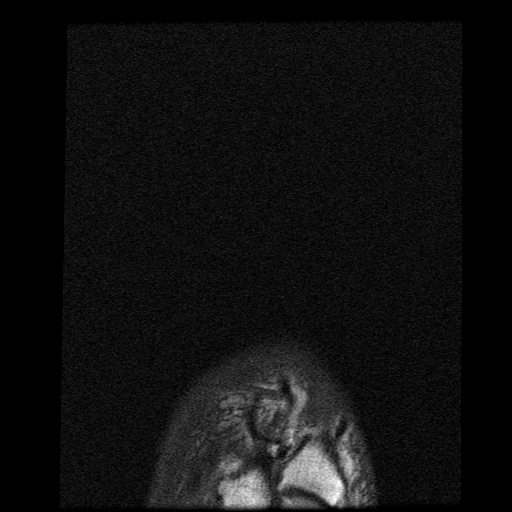

[Series 6: STIR · axial · 3.0mm · 0.25mm/px · z∈[-50,-7]mm · 3 of 18 slices shown]
[im 3/18]
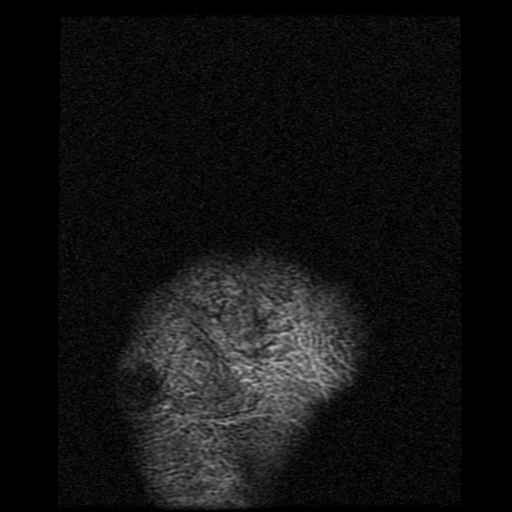
[im 9/18]
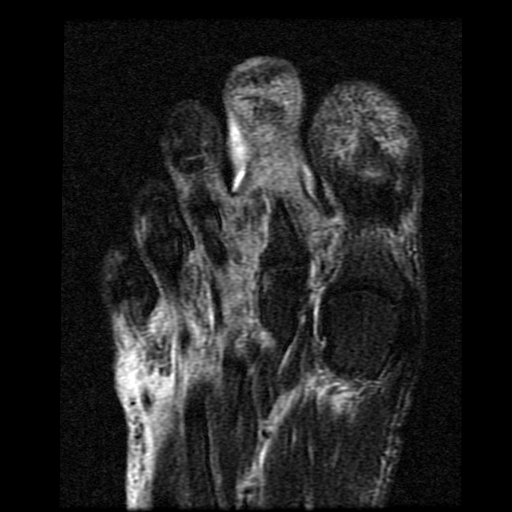
[im 15/18]
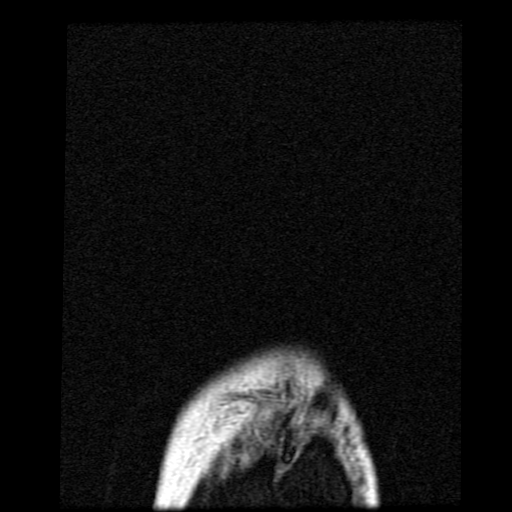

[20 of 40 positions shown; findings below may reference images not displayed]

FINDINGS: There is destruction of the tuft of the distal phalanx of the second
toe with abnormal signal throughout the distal phalangeal bone of
the second toe with a soft tissue edema and swelling of the toe.
Bandages in place.

No joint effusions.  The other bones of the forefoot are normal.
IMPRESSION: Osteomyelitis of the distal phalanx of the second toe with adjacent
cellulitis.

## 2022-01-28 DEATH — deceased
